# Patient Record
Sex: Female | Born: 1980
Health system: Southern US, Community
[De-identification: ages and names within clinical notes are randomized; demographics above are authoritative.]

## PROBLEM LIST (undated history)

## (undated) DIAGNOSIS — F329 Major depressive disorder, single episode, unspecified: Secondary | ICD-10-CM

## (undated) DIAGNOSIS — F32A Depression, unspecified: Secondary | ICD-10-CM

## (undated) HISTORY — DX: Depression, unspecified: F32.A

## (undated) HISTORY — DX: Major depressive disorder, single episode, unspecified: F32.9

---

## 2016-04-15 LAB — HM PAP SMEAR

## 2016-04-24 DIAGNOSIS — Z124 Encounter for screening for malignant neoplasm of cervix: Secondary | ICD-10-CM | POA: Diagnosis not present

## 2016-04-24 DIAGNOSIS — Z01419 Encounter for gynecological examination (general) (routine) without abnormal findings: Secondary | ICD-10-CM | POA: Diagnosis not present

## 2016-08-23 DIAGNOSIS — Z3480 Encounter for supervision of other normal pregnancy, unspecified trimester: Secondary | ICD-10-CM | POA: Diagnosis not present

## 2016-08-23 DIAGNOSIS — Z113 Encounter for screening for infections with a predominantly sexual mode of transmission: Secondary | ICD-10-CM | POA: Diagnosis not present

## 2016-08-23 DIAGNOSIS — Z3401 Encounter for supervision of normal first pregnancy, first trimester: Secondary | ICD-10-CM | POA: Diagnosis not present

## 2016-09-04 DIAGNOSIS — Z3687 Encounter for antenatal screening for uncertain dates: Secondary | ICD-10-CM | POA: Diagnosis not present

## 2016-09-04 DIAGNOSIS — Z3A09 9 weeks gestation of pregnancy: Secondary | ICD-10-CM | POA: Diagnosis not present

## 2016-09-20 DIAGNOSIS — Z3401 Encounter for supervision of normal first pregnancy, first trimester: Secondary | ICD-10-CM | POA: Diagnosis not present

## 2016-09-20 DIAGNOSIS — Z3A11 11 weeks gestation of pregnancy: Secondary | ICD-10-CM | POA: Diagnosis not present

## 2016-09-20 DIAGNOSIS — Z23 Encounter for immunization: Secondary | ICD-10-CM | POA: Diagnosis not present

## 2016-09-21 DIAGNOSIS — Z3689 Encounter for other specified antenatal screening: Secondary | ICD-10-CM | POA: Diagnosis not present

## 2016-09-21 LAB — OB RESULTS CONSOLE ABO/RH: RH TYPE: POSITIVE

## 2016-09-21 LAB — OB RESULTS CONSOLE GC/CHLAMYDIA
Chlamydia: NEGATIVE
Gonorrhea: NEGATIVE

## 2016-09-21 LAB — OB RESULTS CONSOLE RPR: RPR: NONREACTIVE

## 2016-09-21 LAB — OB RESULTS CONSOLE RUBELLA ANTIBODY, IGM: Rubella: IMMUNE

## 2016-09-21 LAB — OB RESULTS CONSOLE VARICELLA ZOSTER ANTIBODY, IGG: Varicella: IMMUNE

## 2016-09-21 LAB — OB RESULTS CONSOLE HIV ANTIBODY (ROUTINE TESTING): HIV: NONREACTIVE

## 2016-09-21 LAB — OB RESULTS CONSOLE HEPATITIS B SURFACE ANTIGEN: HEP B S AG: NEGATIVE

## 2016-09-21 LAB — OB RESULTS CONSOLE ANTIBODY SCREEN: Antibody Screen: NEGATIVE

## 2016-10-16 NOTE — L&D Delivery Note (Signed)
Delivery Note Pt pushed about an hour and at 11:09 AM a viable female was delivered via Vaginal, Spontaneous Delivery (Presentation: OA ).  APGAR: 9, 9; weight pending .   Placenta status: delivered spontaneously.  Cord:  with the following complications: none   Anesthesia:  epidural Episiotomy: None Lacerations:  Second degree Suture Repair: 3.0 vicryl rapide Est. Blood Loss (mL):  225mL  Mom to postpartum.  Baby to Couplet care / Skin to Skin.  Oliver PilaKathy W Jaimie Redditt 04/05/2017, 12:19 PM

## 2016-11-08 DIAGNOSIS — Q270 Congenital absence and hypoplasia of umbilical artery: Secondary | ICD-10-CM | POA: Insufficient documentation

## 2016-11-08 DIAGNOSIS — Z3A18 18 weeks gestation of pregnancy: Secondary | ICD-10-CM | POA: Diagnosis not present

## 2016-11-08 DIAGNOSIS — Z363 Encounter for antenatal screening for malformations: Secondary | ICD-10-CM | POA: Diagnosis not present

## 2016-12-06 DIAGNOSIS — Z1283 Encounter for screening for malignant neoplasm of skin: Secondary | ICD-10-CM | POA: Diagnosis not present

## 2016-12-06 DIAGNOSIS — D229 Melanocytic nevi, unspecified: Secondary | ICD-10-CM | POA: Diagnosis not present

## 2016-12-06 DIAGNOSIS — L821 Other seborrheic keratosis: Secondary | ICD-10-CM | POA: Diagnosis not present

## 2016-12-06 DIAGNOSIS — D485 Neoplasm of uncertain behavior of skin: Secondary | ICD-10-CM | POA: Diagnosis not present

## 2017-01-04 DIAGNOSIS — Z23 Encounter for immunization: Secondary | ICD-10-CM | POA: Diagnosis not present

## 2017-01-04 DIAGNOSIS — Z3689 Encounter for other specified antenatal screening: Secondary | ICD-10-CM | POA: Diagnosis not present

## 2017-01-04 DIAGNOSIS — Z3A27 27 weeks gestation of pregnancy: Secondary | ICD-10-CM | POA: Diagnosis not present

## 2017-01-08 DIAGNOSIS — O99810 Abnormal glucose complicating pregnancy: Secondary | ICD-10-CM | POA: Diagnosis not present

## 2017-01-31 DIAGNOSIS — Q270 Congenital absence and hypoplasia of umbilical artery: Secondary | ICD-10-CM | POA: Diagnosis not present

## 2017-01-31 DIAGNOSIS — Z3A30 30 weeks gestation of pregnancy: Secondary | ICD-10-CM | POA: Diagnosis not present

## 2017-02-12 DIAGNOSIS — O368931 Maternal care for other specified fetal problems, third trimester, fetus 1: Secondary | ICD-10-CM | POA: Diagnosis not present

## 2017-02-12 DIAGNOSIS — Z3A32 32 weeks gestation of pregnancy: Secondary | ICD-10-CM | POA: Diagnosis not present

## 2017-02-15 DIAGNOSIS — O368931 Maternal care for other specified fetal problems, third trimester, fetus 1: Secondary | ICD-10-CM | POA: Diagnosis not present

## 2017-02-15 DIAGNOSIS — Z3A33 33 weeks gestation of pregnancy: Secondary | ICD-10-CM | POA: Diagnosis not present

## 2017-02-19 DIAGNOSIS — Z3A33 33 weeks gestation of pregnancy: Secondary | ICD-10-CM | POA: Diagnosis not present

## 2017-02-19 DIAGNOSIS — O368931 Maternal care for other specified fetal problems, third trimester, fetus 1: Secondary | ICD-10-CM | POA: Diagnosis not present

## 2017-02-22 DIAGNOSIS — O368931 Maternal care for other specified fetal problems, third trimester, fetus 1: Secondary | ICD-10-CM | POA: Diagnosis not present

## 2017-02-22 DIAGNOSIS — Z3A34 34 weeks gestation of pregnancy: Secondary | ICD-10-CM | POA: Diagnosis not present

## 2017-02-26 DIAGNOSIS — Z3A34 34 weeks gestation of pregnancy: Secondary | ICD-10-CM | POA: Diagnosis not present

## 2017-02-26 DIAGNOSIS — O368921 Maternal care for other specified fetal problems, second trimester, fetus 1: Secondary | ICD-10-CM | POA: Diagnosis not present

## 2017-03-01 DIAGNOSIS — Z3685 Encounter for antenatal screening for Streptococcus B: Secondary | ICD-10-CM | POA: Diagnosis not present

## 2017-03-01 DIAGNOSIS — Z3A35 35 weeks gestation of pregnancy: Secondary | ICD-10-CM | POA: Diagnosis not present

## 2017-03-01 DIAGNOSIS — O368921 Maternal care for other specified fetal problems, second trimester, fetus 1: Secondary | ICD-10-CM | POA: Diagnosis not present

## 2017-03-02 LAB — OB RESULTS CONSOLE GBS: STREP GROUP B AG: NEGATIVE

## 2017-03-06 DIAGNOSIS — O368931 Maternal care for other specified fetal problems, third trimester, fetus 1: Secondary | ICD-10-CM | POA: Diagnosis not present

## 2017-03-06 DIAGNOSIS — Q270 Congenital absence and hypoplasia of umbilical artery: Secondary | ICD-10-CM | POA: Diagnosis not present

## 2017-03-06 DIAGNOSIS — Z3A35 35 weeks gestation of pregnancy: Secondary | ICD-10-CM | POA: Diagnosis not present

## 2017-03-09 DIAGNOSIS — Z3A36 36 weeks gestation of pregnancy: Secondary | ICD-10-CM | POA: Diagnosis not present

## 2017-03-09 DIAGNOSIS — O368931 Maternal care for other specified fetal problems, third trimester, fetus 1: Secondary | ICD-10-CM | POA: Diagnosis not present

## 2017-03-13 DIAGNOSIS — O368931 Maternal care for other specified fetal problems, third trimester, fetus 1: Secondary | ICD-10-CM | POA: Diagnosis not present

## 2017-03-13 DIAGNOSIS — Z3A36 36 weeks gestation of pregnancy: Secondary | ICD-10-CM | POA: Diagnosis not present

## 2017-03-16 DIAGNOSIS — Z3A37 37 weeks gestation of pregnancy: Secondary | ICD-10-CM | POA: Diagnosis not present

## 2017-03-16 DIAGNOSIS — O368931 Maternal care for other specified fetal problems, third trimester, fetus 1: Secondary | ICD-10-CM | POA: Diagnosis not present

## 2017-03-19 DIAGNOSIS — O368931 Maternal care for other specified fetal problems, third trimester, fetus 1: Secondary | ICD-10-CM | POA: Diagnosis not present

## 2017-03-19 DIAGNOSIS — Z3A37 37 weeks gestation of pregnancy: Secondary | ICD-10-CM | POA: Diagnosis not present

## 2017-03-22 DIAGNOSIS — O368931 Maternal care for other specified fetal problems, third trimester, fetus 1: Secondary | ICD-10-CM | POA: Diagnosis not present

## 2017-03-22 DIAGNOSIS — Z3A38 38 weeks gestation of pregnancy: Secondary | ICD-10-CM | POA: Diagnosis not present

## 2017-03-26 DIAGNOSIS — O368921 Maternal care for other specified fetal problems, second trimester, fetus 1: Secondary | ICD-10-CM | POA: Diagnosis not present

## 2017-03-26 DIAGNOSIS — Z3A38 38 weeks gestation of pregnancy: Secondary | ICD-10-CM | POA: Diagnosis not present

## 2017-03-29 ENCOUNTER — Telehealth (HOSPITAL_COMMUNITY): Payer: Self-pay | Admitting: *Deleted

## 2017-03-29 ENCOUNTER — Encounter (HOSPITAL_COMMUNITY): Payer: Self-pay | Admitting: *Deleted

## 2017-03-29 DIAGNOSIS — O368999 Maternal care for other specified fetal problems, unspecified trimester, other fetus: Secondary | ICD-10-CM | POA: Diagnosis not present

## 2017-03-29 DIAGNOSIS — Z3A39 39 weeks gestation of pregnancy: Secondary | ICD-10-CM | POA: Diagnosis not present

## 2017-03-29 NOTE — Telephone Encounter (Signed)
Preadmission screen  

## 2017-04-02 DIAGNOSIS — O368931 Maternal care for other specified fetal problems, third trimester, fetus 1: Secondary | ICD-10-CM | POA: Diagnosis not present

## 2017-04-02 DIAGNOSIS — Z3A39 39 weeks gestation of pregnancy: Secondary | ICD-10-CM | POA: Diagnosis not present

## 2017-04-05 ENCOUNTER — Inpatient Hospital Stay (HOSPITAL_COMMUNITY)
Admission: AD | Admit: 2017-04-05 | Discharge: 2017-04-07 | DRG: 775 | Disposition: A | Discharge status: Home / Self Care | Payer: BLUE CROSS/BLUE SHIELD | Source: Ambulatory Visit | Attending: Obstetrics and Gynecology | Admitting: Obstetrics and Gynecology

## 2017-04-05 ENCOUNTER — Inpatient Hospital Stay (HOSPITAL_COMMUNITY): Payer: BLUE CROSS/BLUE SHIELD | Admitting: Anesthesiology

## 2017-04-05 ENCOUNTER — Encounter (HOSPITAL_COMMUNITY): Payer: Self-pay | Admitting: *Deleted

## 2017-04-05 DIAGNOSIS — Z3A40 40 weeks gestation of pregnancy: Secondary | ICD-10-CM

## 2017-04-05 DIAGNOSIS — Z3493 Encounter for supervision of normal pregnancy, unspecified, third trimester: Secondary | ICD-10-CM | POA: Diagnosis not present

## 2017-04-05 DIAGNOSIS — O99340 Other mental disorders complicating pregnancy, unspecified trimester: Secondary | ICD-10-CM | POA: Diagnosis not present

## 2017-04-05 DIAGNOSIS — F329 Major depressive disorder, single episode, unspecified: Secondary | ICD-10-CM | POA: Diagnosis not present

## 2017-04-05 DIAGNOSIS — Z23 Encounter for immunization: Secondary | ICD-10-CM | POA: Diagnosis not present

## 2017-04-05 LAB — COMPREHENSIVE METABOLIC PANEL
ALBUMIN: 3 g/dL — AB (ref 3.5–5.0)
ALT: 12 U/L — AB (ref 14–54)
AST: 22 U/L (ref 15–41)
Alkaline Phosphatase: 186 U/L — ABNORMAL HIGH (ref 38–126)
Anion gap: 12 (ref 5–15)
BUN: 11 mg/dL (ref 6–20)
CHLORIDE: 106 mmol/L (ref 101–111)
CO2: 18 mmol/L — ABNORMAL LOW (ref 22–32)
CREATININE: 0.72 mg/dL (ref 0.44–1.00)
Calcium: 9 mg/dL (ref 8.9–10.3)
GFR calc Af Amer: >60 mL/min (ref >60)
Glucose, Bld: 108 mg/dL — ABNORMAL HIGH (ref 65–99)
Potassium: 3.9 mmol/L (ref 3.5–5.1)
Sodium: 136 mmol/L (ref 135–145)
Total Bilirubin: 0.4 mg/dL (ref 0.3–1.2)
Total Protein: 6 g/dL — ABNORMAL LOW (ref 6.5–8.1)

## 2017-04-05 LAB — LACTATE DEHYDROGENASE: LDH: 150 U/L (ref 98–192)

## 2017-04-05 LAB — CBC
HCT: 36.3 % (ref 36.0–46.0)
Hemoglobin: 12.7 g/dL (ref 12.0–15.0)
MCH: 30 pg (ref 26.0–34.0)
MCHC: 35 g/dL (ref 30.0–36.0)
MCV: 85.6 fL (ref 78.0–100.0)
PLATELETS: 197 10*3/uL (ref 150–400)
RBC: 4.24 MIL/uL (ref 3.87–5.11)
RDW: 14.1 % (ref 11.5–15.5)
WBC: 16.4 10*3/uL — AB (ref 4.0–10.5)

## 2017-04-05 LAB — PROTEIN / CREATININE RATIO, URINE
Creatinine, Urine: 152 mg/dL
PROTEIN CREATININE RATIO: 0.14 mg/mg{creat} (ref 0.00–0.15)
Total Protein, Urine: 22 mg/dL

## 2017-04-05 LAB — TYPE AND SCREEN
ABO/RH(D): A POS
ANTIBODY SCREEN: NEGATIVE

## 2017-04-05 LAB — URIC ACID: Uric Acid, Serum: 6.7 mg/dL — ABNORMAL HIGH (ref 2.3–6.6)

## 2017-04-05 LAB — RPR: RPR: NONREACTIVE

## 2017-04-05 LAB — ABO/RH: ABO/RH(D): A POS

## 2017-04-05 MED ORDER — EPHEDRINE 5 MG/ML INJ
10.0000 mg | INTRAVENOUS | Status: DC | PRN
  Filled 2017-04-05: qty 2

## 2017-04-05 MED ORDER — FLEET ENEMA 7-19 GM/118ML RE ENEM: 1.0000 | ENEMA | RECTAL | Status: DC | PRN

## 2017-04-05 MED ORDER — ZOLPIDEM TARTRATE 5 MG PO TABS
5.0000 mg | ORAL_TABLET | Freq: Every evening | ORAL | Status: DC | PRN
Start: 2017-04-05 — End: 2017-04-07

## 2017-04-05 MED ORDER — LIDOCAINE HCL (PF) 1 % IJ SOLN
30.0000 mL | INTRAMUSCULAR | Status: DC | PRN
  Filled 2017-04-05: qty 30

## 2017-04-05 MED ORDER — PHENYLEPHRINE 40 MCG/ML (10ML) SYRINGE FOR IV PUSH (FOR BLOOD PRESSURE SUPPORT)
PREFILLED_SYRINGE | INTRAVENOUS | Status: AC
  Filled 2017-04-05: qty 10

## 2017-04-05 MED ORDER — ACETAMINOPHEN 325 MG PO TABS
650.0000 mg | ORAL_TABLET | ORAL | Status: DC | PRN
Start: 2017-04-05 — End: 2017-04-07
  Administered 2017-04-06 – 2017-04-07 (×3): 650 mg via ORAL
  Filled 2017-04-05 (×3): qty 2

## 2017-04-05 MED ORDER — COCONUT OIL OIL: 1.0000 "application " | TOPICAL_OIL | Status: DC | PRN

## 2017-04-05 MED ORDER — FENTANYL 2.5 MCG/ML BUPIVACAINE 1/10 % EPIDURAL INFUSION (WH - ANES)
14.0000 mL/h | INTRAMUSCULAR | Status: DC | PRN
  Administered 2017-04-05: 14 mL/h via EPIDURAL

## 2017-04-05 MED ORDER — TETANUS-DIPHTH-ACELL PERTUSSIS 5-2.5-18.5 LF-MCG/0.5 IM SUSP: 0.5000 mL | Freq: Once | INTRAMUSCULAR | Status: DC

## 2017-04-05 MED ORDER — WITCH HAZEL-GLYCERIN EX PADS
1.0000 "application " | MEDICATED_PAD | CUTANEOUS | Status: DC | PRN
  Administered 2017-04-05: 1 via TOPICAL

## 2017-04-05 MED ORDER — SOD CITRATE-CITRIC ACID 500-334 MG/5ML PO SOLN: 30.0000 mL | ORAL | Status: DC | PRN

## 2017-04-05 MED ORDER — DIPHENHYDRAMINE HCL 50 MG/ML IJ SOLN: 12.5000 mg | INTRAMUSCULAR | Status: DC | PRN

## 2017-04-05 MED ORDER — OXYCODONE-ACETAMINOPHEN 5-325 MG PO TABS: 2.0000 | ORAL_TABLET | ORAL | Status: DC | PRN

## 2017-04-05 MED ORDER — BENZOCAINE-MENTHOL 20-0.5 % EX AERO
1.0000 "application " | INHALATION_SPRAY | CUTANEOUS | Status: DC | PRN
  Administered 2017-04-05: 1 via TOPICAL
  Filled 2017-04-05: qty 56

## 2017-04-05 MED ORDER — ONDANSETRON HCL 4 MG/2ML IJ SOLN
4.0000 mg | INTRAMUSCULAR | Status: DC | PRN
Start: 2017-04-05 — End: 2017-04-07

## 2017-04-05 MED ORDER — ONDANSETRON HCL 4 MG PO TABS: 4.0000 mg | ORAL_TABLET | ORAL | Status: DC | PRN

## 2017-04-05 MED ORDER — ACETAMINOPHEN 325 MG PO TABS: 650.0000 mg | ORAL_TABLET | ORAL | Status: DC | PRN

## 2017-04-05 MED ORDER — LACTATED RINGERS IV SOLN: 500.0000 mL | INTRAVENOUS | Status: DC | PRN

## 2017-04-05 MED ORDER — OXYTOCIN BOLUS FROM INFUSION
500.0000 mL | Freq: Once | INTRAVENOUS | Status: AC
  Administered 2017-04-05: 500 mL via INTRAVENOUS

## 2017-04-05 MED ORDER — PHENYLEPHRINE 40 MCG/ML (10ML) SYRINGE FOR IV PUSH (FOR BLOOD PRESSURE SUPPORT)
80.0000 ug | PREFILLED_SYRINGE | INTRAVENOUS | Status: DC | PRN
  Filled 2017-04-05: qty 5

## 2017-04-05 MED ORDER — OXYCODONE-ACETAMINOPHEN 5-325 MG PO TABS: 1.0000 | ORAL_TABLET | ORAL | Status: DC | PRN

## 2017-04-05 MED ORDER — ONDANSETRON HCL 4 MG/2ML IJ SOLN
4.0000 mg | Freq: Four times a day (QID) | INTRAMUSCULAR | Status: DC | PRN
  Administered 2017-04-05 (×2): 4 mg via INTRAVENOUS
  Filled 2017-04-05 (×2): qty 2

## 2017-04-05 MED ORDER — SENNOSIDES-DOCUSATE SODIUM 8.6-50 MG PO TABS
2.0000 | ORAL_TABLET | ORAL | Status: DC
  Administered 2017-04-06 (×2): 2 via ORAL
  Filled 2017-04-05 (×2): qty 2

## 2017-04-05 MED ORDER — SIMETHICONE 80 MG PO CHEW: 80.0000 mg | CHEWABLE_TABLET | ORAL | Status: DC | PRN

## 2017-04-05 MED ORDER — DIPHENHYDRAMINE HCL 25 MG PO CAPS: 25.0000 mg | ORAL_CAPSULE | Freq: Four times a day (QID) | ORAL | Status: DC | PRN

## 2017-04-05 MED ORDER — LIDOCAINE HCL (PF) 1 % IJ SOLN
INTRAMUSCULAR | Status: DC | PRN
  Administered 2017-04-05: 5 mL via EPIDURAL
  Administered 2017-04-05: 6 mL
  Administered 2017-04-05: 4 mL

## 2017-04-05 MED ORDER — FENTANYL 2.5 MCG/ML BUPIVACAINE 1/10 % EPIDURAL INFUSION (WH - ANES)
INTRAMUSCULAR | Status: AC
  Filled 2017-04-05: qty 100

## 2017-04-05 MED ORDER — DIBUCAINE 1 % RE OINT: 1.0000 "application " | TOPICAL_OINTMENT | RECTAL | Status: DC | PRN

## 2017-04-05 MED ORDER — PRENATAL MULTIVITAMIN CH
1.0000 | ORAL_TABLET | Freq: Every day | ORAL | Status: DC
  Administered 2017-04-06: 1 via ORAL
  Filled 2017-04-05: qty 1

## 2017-04-05 MED ORDER — LACTATED RINGERS IV SOLN: 500.0000 mL | Freq: Once | INTRAVENOUS | Status: DC

## 2017-04-05 MED ORDER — OXYTOCIN 40 UNITS IN LACTATED RINGERS INFUSION - SIMPLE MED
2.5000 [IU]/h | INTRAVENOUS | Status: DC
  Filled 2017-04-05: qty 1000

## 2017-04-05 MED ORDER — IBUPROFEN 600 MG PO TABS
600.0000 mg | ORAL_TABLET | Freq: Four times a day (QID) | ORAL | Status: DC
  Administered 2017-04-05 – 2017-04-07 (×7): 600 mg via ORAL
  Filled 2017-04-05 (×9): qty 1

## 2017-04-05 MED ORDER — LACTATED RINGERS IV SOLN
INTRAVENOUS | Status: DC
  Administered 2017-04-05: 05:00:00 via INTRAVENOUS

## 2017-04-05 NOTE — Progress Notes (Signed)
Patient ID: Jean MayoJennifer Gluth, female   DOB: 06/09/1981, 36 y.o.   MRN: [id number] Per Dr. Mindi SlickerBanga, pt seen and does not want to push as feeling no pressure.  Will recheck in an hour FHR category 1 with occasional mild early decels

## 2017-04-05 NOTE — Anesthesia Postprocedure Evaluation (Signed)
Anesthesia Post Note  Patient: Jean [name]  Procedure(s) Performed: * No procedures listed *     Patient location during evaluation: Mother Baby Anesthesia Type: Epidural Level of consciousness: awake and alert Pain management: pain level controlled Vital Signs Assessment: post-procedure vital signs reviewed and stable Respiratory status: spontaneous breathing, nonlabored ventilation and respiratory function stable Cardiovascular status: stable Postop Assessment: no headache, no backache and epidural receding Anesthetic complications: no    Last Vitals:  Vitals:   04/05/17 1326 04/05/17 1438  BP: 138/66 140/85  Pulse: (!) 57 60  Resp: 18 18  Temp: 36.8 C 36.4 C    Last Pain:  Vitals:   04/05/17 1438  TempSrc: Oral  PainSc:    Pain Goal: Patients Stated Pain Goal: 0 (04/05/17 0149)               Junious SilkGILBERT,Janeli Lewison

## 2017-04-05 NOTE — Lactation Note (Signed)
This note was copied from a baby's chart. Lactation Consultation Note  Patient Name: Jean [name] ZOXWR'UToday's Date: 04/05/2017   Attempted to consult with mom of < 1 hour old [facility]. Doula and FOB present in the room. Infant STS and fussy. Mom feeling very nauseated and gave infant to FOB for STS. Crackers and Ginger Ale taken to mom. RN was notified and Mom receiving anti nausea medication and LC will return at a later time.      Maternal Data    Feeding    LATCH Score/Interventions                      Lactation Tools Discussed/Used     Consult Status      Ed BlalockSharon S Erhard Senske 04/05/2017, 11:45 AM

## 2017-04-05 NOTE — Progress Notes (Signed)
Patient ID: Jean MayoJennifer Odom, female   DOB: 03/13/1981, 36 y.o.   MRN: [id number] Pt now consenting to exam afeb VSS FHR with mild variable decels  C/c/+2  To begin pushing

## 2017-04-05 NOTE — Progress Notes (Signed)
Patient ID: Jean MayoJennifer [name], female   DOB: 06/18/1981, 36 y.o.   MRN: [id number] Pt comfortable with epidural. She agreed to AROM now.  AROM performed with moderate return of clear fluid Attempted pushing for 20 mins with no descent Cat 1 strip Ctxs q 3mins 10/100/+1  Plan: Labor down for 30 mins then resume pushing          Anticipate svd

## 2017-04-05 NOTE — Anesthesia Pain Management Evaluation Note (Signed)
  CRNA Pain Management Visit Note  Patient: Jean MayoJennifer Anello, 36 y.o., female  "Hello I am a member of the anesthesia team at [facility]. We have an anesthesia team available at all times to provide care throughout the hospital, including epidural management and anesthesia for C-section. I don't know your plan for the delivery whether it a natural birth, water birth, IV sedation, nitrous supplementation, doula or epidural, but we want to meet your pain goals."   1.Was your pain managed to your expectations on prior hospitalizations?   No prior hospitalizations  2.What is your expectation for pain management during this hospitalization?     Epidural  3.How can we help you reach that goal? Epidural intact  Record the patient's initial score and the patient's pain goal.   Pain: 1  Pain Goal: 9 [facility] wants you to be able to say your pain was always managed very well.  Edison PaceWILKERSON,Ailany Koren 04/05/2017

## 2017-04-05 NOTE — Lactation Note (Signed)
This note was copied from a baby's chart. Lactation Consultation Note  Patient Name: Girl Perlie MayoJennifer Vidrine HYQMV'HToday's Date: 04/05/2017 Reason for consult: Initial assessment   Initial assessment with mom of < 1 hour old infant. Infant STS with FOB and cueing to feed. Mom reluctant to latch infant to the breast as she is not feeling well. Discussed that infant is cueing to feed and is good to latch infant with feeding cues or we may miss her feeding. Mom then agreeable to latching infant.   Mom did not want to place infant across her abdomen. Infant was placed to left breast in the football hold. She had some difficulty maintaining latch and fed off and on for 15 minutes. She was then repositioned to right breast and fed for about 15 minutes in the football hold. Infant then fell asleep and was place back STS with FOB. Mom drowsy and LC held infant for entire feeding, mom asked LC to stay for entire feeding. Mom did note some pinching at times with feeding, infant was noted to be tongue sucking and tongue thrusting at times.   Mom with semi compressible breasts with short shaft everted nipples, unable to hand express colostrum at this time. Mom reports + breast changes with pregnancy. Enc mom to feed infant STS 8-12 x in 24 hours at first feeding cues. Enc mom to hand express prior to each feeding. Enc mom to feed infant as long as she desires, offering both breasts with each feeding. Enc mom to massage/compress breast with feeding. Advised mom to use good head and pillow support with feeding.   Reviewed colostrum, milk coming to volume, supply and demand, cluster feeding, NB nutritional needs, and NB feeding behaviors discussed.Feel mom will need reiteration of all teaching. Feeding log given with instructions for use.Enc mom to call out for feeding assistance as needed.    BF Resources Handout and LC Brochure, mom informed of IP/OP services, BF Support Groups and LC phone #. Mom and dad without further  questions/concerns at this time.    Maternal Data Formula Feeding for Exclusion: No Has patient been taught Hand Expression?: Yes Does the patient have breastfeeding experience prior to this delivery?: No  Feeding Feeding Type: Breast Fed Length of feed: 30 min  LATCH Score/Interventions Latch: Repeated attempts needed to sustain latch, nipple held in mouth throughout feeding, stimulation needed to elicit sucking reflex. Intervention(s): Adjust position;Assist with latch;Breast massage;Breast compression  Audible Swallowing: A few with stimulation Intervention(s): Skin to skin;Hand expression;Alternate breast massage  Type of Nipple: Everted at rest and after stimulation  Comfort (Breast/Nipple): Soft / non-tender     Hold (Positioning): Full assist, staff holds infant at breast Intervention(s): Breastfeeding basics reviewed;Support Pillows;Position options;Skin to skin  LATCH Score: 6  Lactation Tools Discussed/Used WIC Program: No   Consult Status Consult Status: Follow-up Date: 04/06/17 Follow-up type: In-patient    Silas FloodSharon S Donalee Gaumond 04/05/2017, 12:46 PM

## 2017-04-05 NOTE — Anesthesia Preprocedure Evaluation (Signed)

## 2017-04-05 NOTE — Anesthesia Procedure Notes (Signed)
Epidural Patient location during procedure: OB  Staffing Anesthesiologist: Cristela BlueJACKSON, Jean Jean [name]  Preanesthetic Checklist Completed: patient identified, site marked, surgical consent, pre-op evaluation, timeout performed, IV checked, risks and benefits discussed and monitors and equipment checked  Epidural Patient position: sitting Prep: site prepped and draped and DuraPrep Patient monitoring: continuous pulse ox and blood pressure Approach: midline Location: L4-L5 Injection technique: LOR air  Needle:  Needle type: Tuohy  Needle gauge: 17 G Needle length: 9 cm and 9 Needle insertion depth: 6 cm Catheter type: closed end flexible Catheter size: 19 Gauge Catheter at skin depth: 12 cm Test dose: negative  Assessment Events: blood not aspirated, injection not painful, no injection resistance, negative IV test and no paresthesia  Additional Notes Initial dosing of Epidural:  1st dose, through catheter ............................................Marland Kitchen.  Xylocaine 40 mg  2nd dose, through catheter, after waiting 3 minutes........Marland Kitchen.Xylocaine 60 mg    As each dose occurred, patient was free of IV sx; and patient exhibited no evidence of SA injection.  Patient is more comfortable after epidural dosed. Please see RN's note for documentation of vital signs,and FHR which are stable.  Patient reminded not to try to ambulate with numb legs, and that an RN must be present when she attempts to get up.

## 2017-04-05 NOTE — H&P (Signed)
Jean [name] is a 36 y.o. female presenting for painful contractions. Pt states they have been intermittent the past few weeks but intensified at 8-10pm last night. On arrival pt was 7cm ( MAU) and then complete on L/D. Her prenatal care has been complicated by a single umbilical artery. She got twice weekly NSTs starting at 32weeks. Hx depression- weaned off zoloft at [redacted]weeks gestation; plans to restart pp. Dating per lmp and confirmed with 9 week [place]. Declined genetic and carrier screening OB History    Gravida Para Term Preterm AB Living   1             SAB TAB Ectopic Multiple Live Births                 Past Medical History:  Diagnosis Date  . Depression    No past surgical history on file. Family History: family history is not on file. Social History:  has no tobacco, alcohol, and drug history on file.     Maternal Diabetes: No Genetic Screening: Declined Maternal Ultrasounds/Referrals: Normal Fetal Ultrasounds or other Referrals:  None Maternal Substance Abuse:  No Significant Maternal Medications:  None Significant Maternal Lab Results:  Lab values include: Group B Strep negative Other Comments:  None  Review of Systems  Constitutional: Positive for malaise/fatigue. Negative for chills, fever and weight loss.  Eyes: Negative for blurred vision.  Respiratory: Negative for shortness of breath.   Cardiovascular: Negative for chest pain.  Gastrointestinal: Positive for abdominal pain. Negative for heartburn, nausea and vomiting.  Genitourinary: Negative for dysuria.  Musculoskeletal: Positive for back pain and myalgias.  Skin: Negative for itching and rash.  Neurological: Negative for dizziness and headaches.  Psychiatric/Behavioral: Negative for depression, hallucinations, substance abuse and suicidal ideas. The patient is not nervous/anxious.    Maternal Medical History:  Reason for admission: Contractions.  Nausea.  Contractions: Onset was 3-5 hours ago.    Frequency: regular.   Perceived severity is strong.    Fetal activity: Perceived fetal activity is normal.   Last perceived fetal movement was within the past hour.    Prenatal complications: no prenatal complications Prenatal Complications - Diabetes: none.    Dilation: 10 Effacement (%): 100 Station: +1 Exam by:: Irving Burton Rothermel RN  Blood pressure (!) 150/113, pulse 76, temperature 98.3 F (36.8 C), temperature source Axillary, resp. rate 18, height 5\' 9"  (1.753 m), weight 237 lb (107.5 kg), last menstrual period 06/29/2016, SpO2 98 %. Maternal Exam:  Uterine Assessment: Contraction strength is firm.  Contraction frequency is regular.   Abdomen: Patient reports generalized tenderness.  Estimated fetal weight is AGA.   Fetal presentation: vertex  Introitus: Normal vulva. Vulva is negative for condylomata and lesion.  Normal vagina.  Vagina is negative for condylomata and discharge.  Pelvis: adequate for delivery.   Cervix: Cervix evaluated by digital exam.     Physical Exam  Constitutional: She is oriented to person, place, and time. She appears well-developed and well-nourished.  Neck: Normal range of motion.  Cardiovascular: Normal rate.   Respiratory: Effort normal.  GI: Soft. There is generalized tenderness.  Genitourinary: Vagina normal and uterus normal. Vulva exhibits no lesion. No vaginal discharge found.  Musculoskeletal: Normal range of motion.  Neurological: She is alert and oriented to person, place, and time.  Skin: Skin is warm.  Psychiatric: She has a normal mood and affect. Her behavior is normal. Judgment and thought content normal.    Prenatal labs: ABO, Rh: --/--/A POS (06/21 0232) Antibody:  NEG (06/21 0232) Rubella: Immune (12/07 0000) RPR: Nonreactive (12/07 0000)  HBsAg: Negative (12/07 0000)  HIV: Non-reactive (12/07 0000)  GBS: Negative (05/18 0000)   Assessment/Plan: G1P0 at 40 0/[redacted]wks gestation in active labor Pain control per  epidural GBS negative Anticipate svd   Cecilia W Banga 04/05/2017, 3:37 AM

## 2017-04-05 NOTE — Progress Notes (Signed)
Patient ID: Jean MayoJennifer [name], female   DOB: 08/10/1981, 36 y.o.   MRN: [id number] Pt comfortable with epidural but trying to get her strength up afeb vss FHR category 1  Pt declines cervical check, saying she would like to wait until feeling stronger  Since FHR looks fine, agree to wait but reminded her she has not been examined since 520am

## 2017-04-05 NOTE — MAU Note (Signed)
Contractions since 8 pm, got stronger at 10 pm. No leaking. No bleeding. Baby moving well.

## 2017-04-06 LAB — CBC
HEMATOCRIT: 34.4 % — AB (ref 36.0–46.0)
Hemoglobin: 11.5 g/dL — ABNORMAL LOW (ref 12.0–15.0)
MCH: 29.7 pg (ref 26.0–34.0)
MCHC: 33.4 g/dL (ref 30.0–36.0)
MCV: 88.9 fL (ref 78.0–100.0)
PLATELETS: 177 10*3/uL (ref 150–400)
RBC: 3.87 MIL/uL (ref 3.87–5.11)
RDW: 14.4 % (ref 11.5–15.5)
WBC: 17.8 10*3/uL — ABNORMAL HIGH (ref 4.0–10.5)

## 2017-04-06 NOTE — Progress Notes (Signed)
Patient ID: Jean MayoJennifer Smoots, female   DOB: 12/03/1980, 36 y.o.   MRN: [id number] PPD #1 No problems Afeb, VSS Fundus firm, NT at U-1 Continue routine postpartum care

## 2017-04-06 NOTE — Progress Notes (Signed)
Jean [name] was referred for history of depression/anxiety. * Referral screened out by Clinical Social Worker because none of the following criteria appear to apply: ~ History of anxiety/depression during this pregnancy, or of post-partum depression. ~ Diagnosis of anxiety and/or depression within last 3 years OR * Jean [name]'s symptoms currently being treated with medication and/or therapy. Please contact the Clinical Social Worker if needs arise, or if Jean [name] requests.  Jean [name]'s chart notes "hx depression- weaned off zoloft at [redacted]weeks gestation; plans to restart pp."  CSW spoke with bedside RN to ensure this is addressed now that patient has delivered. 

## 2017-04-06 NOTE — Lactation Note (Signed)
This note was copied from a baby's chart. Lactation Consultation Note  Patient Name: Jean Perlie MayoJennifer Hing RUEAV'WToday's Date: 04/06/2017 Reason for consult: Follow-up assessment Mom asking for assist due to sore nipples.  She has positional stripes on both nipples and c/o pain with feedings.  Oral exam done.  Baby has good extension of tongue but some restriction with elevation. Possible tight posterior frenulum.   Assisted mom with positioning baby in football hold.  Baby opened wide and latched easily with lips flanged.  Mom states pain is a 7/10.  We took baby off breast.  Nipple is slightly pinched.  24 mm nipple shield applied.  Baby latched well and pain down to 4.  Baby nursed actively for 15 minutes and colostrum in shield after feeding.  Mom feeling more hopeful.  DEBP set up and initiated.  Instructed to pump 4-6 times per day after feeding to increase stimulation.  Breast shells given with instructions.  Parents asking many good questions which were answered.  Encouraged to call for assist/concerns.  Maternal Data    Feeding Feeding Type: Breast Fed Length of feed: 15 min  LATCH Score/Interventions Latch: Grasps breast easily, tongue down, lips flanged, rhythmical sucking. Intervention(s): Breast compression;Breast massage;Assist with latch;Adjust position  Audible Swallowing: Spontaneous and intermittent Intervention(s): Skin to skin;Hand expression;Alternate breast massage  Type of Nipple: Everted at rest and after stimulation  Comfort (Breast/Nipple): Filling, red/small blisters or bruises, mild/mod discomfort  Problem noted: Mild/Moderate discomfort;Cracked, bleeding, blisters, bruises Interventions (Mild/moderate discomfort): Hand expression  Hold (Positioning): Assistance needed to correctly position infant at breast and maintain latch. Intervention(s): Breastfeeding basics reviewed;Support Pillows;Position options;Skin to skin  LATCH Score: 8  Lactation Tools  Discussed/Used Tools: Nipple Shields Nipple shield size: 24 Pump Review: Setup, frequency, and cleaning;Milk Storage Initiated by:: LC Date initiated:: 04/07/17   Consult Status Consult Status: Follow-up Date: 04/07/17 Follow-up type: In-patient    Huston FoleyMOULDEN, Megan Presti S 04/06/2017, 3:24 PM

## 2017-04-07 MED ORDER — IBUPROFEN 600 MG PO TABS: 600.0000 mg | ORAL_TABLET | Freq: Four times a day (QID) | ORAL | 0 refills | Status: DC

## 2017-04-07 NOTE — Discharge Summary (Signed)
OB Discharge Summary     Patient Name: Jean [name] DOB: 1981-02-22 MRN: [id number]  Date of admission: 04/05/2017 Delivering MD: Huel Cote   Date of discharge: 04/07/2017  Admitting diagnosis: 40 WEEKS CTX Intrauterine pregnancy: [redacted]w[redacted]d     Secondary diagnosis:  Active Problems:   Normal labor   NSVD (normal spontaneous vaginal delivery)      Discharge diagnosis: Term Pregnancy Delivered                                  Hospital course:  Onset of Labor With Vaginal Delivery     36 y.o. yo G1P1001 at [redacted]w[redacted]d was admitted in Active Labor on 04/05/2017. Patient had an uncomplicated labor course as follows:  Membrane Rupture Time/Date: 5:03 AM ,04/05/2017   Intrapartum Procedures: Episiotomy: None [1]                                         Lacerations:  2nd degree [3];Perineal [11]  Patient had a delivery of a Viable infant. 04/05/2017  Information for the patient's newborn:  Jean, Gretzinger Girl [name] [[id number]]  Delivery Method: Vaginal, Spontaneous Delivery (Filed from Delivery Summary)    Pateint had an uncomplicated postpartum course.  She is ambulating, tolerating a regular diet, passing flatus, and urinating well. Patient is discharged home in stable condition on 04/07/17.   Physical exam  Vitals:   04/06/17 0130 04/06/17 0545 04/06/17 1833 04/07/17 0534  BP: 135/76 132/75 134/68 134/76  Pulse: 75 64 73 68  Resp: 18 18 18 18   Temp: 97.7 F (36.5 C) 98.3 F (36.8 C) 98.3 F (36.8 C) 98 F (36.7 C)  TempSrc: Oral Oral Oral Oral  SpO2: 100% 97%    Weight:      Height:       General: alert Lochia: appropriate Uterine Fundus: firm  Labs: Lab Results  Component Value Date   WBC 17.8 (H) 04/06/2017   HGB 11.5 (L) 04/06/2017   HCT 34.4 (L) 04/06/2017   MCV 88.9 04/06/2017   PLT 177 04/06/2017   CMP Latest Ref Rng & Units 04/05/2017  Glucose 65 - 99 mg/dL 782(N)  BUN 6 - 20 mg/dL 11  Creatinine 5.62 - 1.30 mg/dL 8.65  Sodium 784 - 696 mmol/L 136   Potassium 3.5 - 5.1 mmol/L 3.9  Chloride 101 - 111 mmol/L 106  CO2 22 - 32 mmol/L 18(L)  Calcium 8.9 - 10.3 mg/dL 9.0  Total Protein 6.5 - 8.1 g/dL 6.0(L)  Total Bilirubin 0.3 - 1.2 mg/dL 0.4  Alkaline Phos 38 - 126 U/L 186(H)  AST 15 - 41 U/L 22  ALT 14 - 54 U/L 12(L)    Discharge instruction: per After Visit Summary and "Baby and Me Booklet".  After visit meds:  Allergies as of 04/07/2017   No Known Allergies     Medication List    STOP taking these medications   ranitidine 75 MG tablet Commonly known as:  ZANTAC     TAKE these medications   cetirizine 10 MG tablet Commonly known as:  ZYRTEC Take 10 mg by mouth daily.   ibuprofen 600 MG tablet Commonly known as:  ADVIL,MOTRIN Take 1 tablet (600 mg total) by mouth every 6 (six) hours.   prenatal multivitamin Tabs tablet Take 1 tablet by mouth daily at 12 noon.  Diet: routine diet  Activity: Advance as tolerated. Pelvic rest for 6 weeks.   Outpatient follow up:3 weeks   Newborn Data: Live born female  Birth Weight: 8 lb 13.5 oz (4010 g) APGAR: 9, 9  Baby Feeding: Breast Disposition:home with mother   04/07/2017 Jean Nieceodd D Orly Quimby, MD

## 2017-04-07 NOTE — Lactation Note (Signed)
This note was copied from a baby's chart. Lactation Consultation Note  Patient Name: Jean Perlie MayoJennifer Vinsant ZOXWR'UToday's Date: [name] Reason for consult: Follow-up assessment;Infant weight loss (6% weight loss , Serum Bli - 9.6 this am )  Baby is 6247 hours old and for D/C today.  LC reviewed and updated doc flow sheets Per mom sore nipples have improved with the Shells and nipple Shield.  LC assessed nipples with moms permission and noted the positional strips still present,  LC recommended to continue using the Nipple shield and instill EBM into the top with a curved tip syringe  For an appetizer with every feeding [facility] . On Friday 6/29 at 11:30 am.  Per mom has a DEBP Medela at home and plans to start post pumping , didn't get a chance in the hospital.  ( even though it was set up yesterday . LC instructed mom on the use of hand pump for pre - pumping if needed. Sore nipple and engorgement prevention and tx reviewed.  Mother informed of post-discharge support and given phone number to the lactation department, including services for phone call assistance; out-patient appointments; and breastfeeding support group. List of other breastfeeding resources in the community given in the handout. Encouraged mother to call for problems or concerns related to breastfeeding.    Maternal Data    Feeding Feeding Type:  (per mom baby last fed at 7:25 am for 35 mins and milk noted in the NS ) Length of feed: 30 min  LATCH Score/Interventions ( [facility] )  Latch: Grasps breast easily, tongue down, lips flanged, rhythmical sucking. Intervention(s): Breast compression;Breast massage  Audible Swallowing: Spontaneous and intermittent Intervention(s): Skin to skin  Type of Nipple: Everted at rest and after stimulation  Comfort (Breast/Nipple): Filling, red/small blisters or bruises, mild/mod discomfort  Problem noted:  (positional strips  noted on both , permom nipples better )  Hold (Positioning): No assistance needed to correctly position infant at breast. Intervention(s): Breastfeeding basics reviewed  LATCH Score: 9  Lactation Tools Discussed/Used Tools: Shells;Pump;Other (comment) (curved tip with instructions to instill EBM in the top ) Nipple shield size: 24;Other (comment) (per mom still fets well ) Shell Type: Inverted Breast pump type: Manual   Consult Status Consult Status: Follow-up Date: 04/13/17 (at 11;30 am , appt reminder given to mom ) Follow-up type: Out-patient    Jean [name] [name], 10:28 AM

## 2017-04-07 NOTE — Progress Notes (Signed)
Patient ID: Jean MayoJennifer Mccardle, female   DOB: 04/13/1981, 36 y.o.   MRN: [id number] PPD #2 Doing well Afeb, VSS D/c home

## 2017-04-07 NOTE — Discharge Instructions (Signed)
As per discharge pamphlet °

## 2017-04-12 ENCOUNTER — Inpatient Hospital Stay (HOSPITAL_COMMUNITY): Admission: RE | Admit: 2017-04-12 | Payer: BLUE CROSS/BLUE SHIELD | Source: Ambulatory Visit

## 2017-04-13 ENCOUNTER — Ambulatory Visit: Payer: Self-pay

## 2017-04-13 NOTE — Lactation Note (Signed)
This note was copied from a baby's chart. 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: 8# 7oz Today's weight: 3860g (about 8# 8.3) ________________________________________________________________________  Mother's Name: Jean GripMichaela Rose [name] Type of delivery:  Vaginal, Spontaneous Delivery Breastfeeding Experience: primip Maternal Medical Conditions:  H/o depression Maternal Medications: PNV  ________________________________________________________________________  Breastfeeding History (Post Discharge)  Frequency of breastfeeding: 7 times/day Duration of feeding:  20-7430min  Pumping  Type of pump:  Medela pump in style Frequency: bid-tid  Volume:  4 ozml  Bottle of pumped milk: drained 3 oz Nuk "Simply Natural"  Infant Intake and Output Assessment  Voids:  5 in 24 hrs.  Color:  Clear yellow Stools:  6 in 24 hrs.  Color:  Yellow  ________________________________________________________________________  Maternal Breast Assessment  Breast:  Compressible Nipple:  Erect. Had position stripes while in hospital, using shells and coconut oil.  She reports that her nipples feel much better.  Feeding Assessment/Evaluation  Initial feeding assessment:  Infant's oral assessment:  WNL. Good elevation & extension of tongue noted.   Attached assessment:  Shallow, but can get deep if chin lowered  Lips flanged:  Yes.    Lips untucked:  Yes.    Suck assessment:  Displays both  Tools:  Nipple shield 24 mm Instructed on use and cleaning of tool:  Yes.    Pre-feed weight: 3878 g  (8 lb. 8.8 oz.) Post-feed weight:  3914 g  Amount transferred:  36 ml in  31 min R breast, cross-cradle, with nipple shield  Pre-feed weight: 3914 g   Post-feed weight: 3926g Amount transferred: 12 ml in 7 min L breast, no nipple shield  Total amount transferred: 48 ml  Infant is 908 days old and is 3.7% below BW. Infant has gained about 1 oz+ since weight yesterday. Infant has been feeding with a nipple shield since discharge. Infant was observed to latch on R breast with nipple shield; Mom needed some assistance with better application of nipple shield & holding of infant's hands. Mom was comfortable w/latch. Infant was able to latch to L breast without nipple shield & Mom had no discomfort.   Mom has no breast complaints.   Mom's goal is to try to have Jean latch to the bare breast. This & a multitude of other breastfeeding-related questions were answered. Mom to f/u as desired.  Glenetta HewKim Eoghan Belcher, RN, IBCLC

## 2017-04-20 ENCOUNTER — Telehealth (HOSPITAL_COMMUNITY): Payer: Self-pay | Admitting: Lactation Services

## 2017-04-20 NOTE — Telephone Encounter (Signed)
Mom called with concerns.  She was seen on 04/13/17 as an outpatient.  Parents took baby to the pediatricians today and the baby had lost 8 ounces.  Pediatrician told her to feed every 2 hours and give 1/2 ounce of supplement after feeds.  Instructed to post pump after day and evening feedings and give baby any milk obtained.  Mom states she has been pumping twice per day and obtaining 60 mls.  Milk is in the freezer.  Instructed mom to take milk out of freezer to use it daily.  Lactation outpatient appointment scheduled for Monday July 9 at 3:30 pm.

## 2017-04-23 ENCOUNTER — Ambulatory Visit (HOSPITAL_COMMUNITY)
Admission: RE | Admit: 2017-04-23 | Discharge: 2017-04-23 | Disposition: A | Discharge status: Home / Self Care | Payer: BLUE CROSS/BLUE SHIELD | Source: Ambulatory Visit | Attending: Pediatrics | Admitting: Pediatrics

## 2017-04-23 DIAGNOSIS — F9829 Other feeding disorders of infancy and early childhood: Secondary | ICD-10-CM | POA: Diagnosis not present

## 2017-04-23 NOTE — Lactation Note (Signed)
Lactation Consult  Mother's reason for visit:  Follow up, weight loss Visit Type:  Feeding assessment Appointment Notes:  Weight loss Consult:  Follow-Up Lactation Consultant:  Huston FoleyMOULDEN, Carlina Derks S  ________________________________________________________________________ Pecola LeisureBabyTrudee Grip: Michaela Rose DOB:[birth year] Birth weight:8-13.5 Discharge weight:8-5.3 Weight at pedi on 04/20/17: 8-0 Weight today: 8-13.7   ________________________________________________________________________  Mother's Name: Perlie MayoJennifer Weinfeld Type of delivery:  vaginal Breastfeeding Experience:  First time mom     ________________________________________________________________________  Breastfeeding History (Post Discharge)  Frequency of breastfeeding:  Every 3 hours Duration of feeding:  30+ minutes  Supplementation      Breastmilk:  Volume 30-60 ml Frequency:  Every 3 hours   Method:  Bottle,   Pumping  Type of pump:  Medela pump in style Frequency:  7 times in 24 hours Volume: 30-60 ml  Infant Intake and Output Assessment  Voids: 8  in 24 hrs.  Color:  Clear yellow Stools: 4-5  in 24 hrs.  Color:  Yellow  ________________________________________________________________________  Maternal Breast Assessment  Breast:  Soft Nipple:  Erect Pain level:  0 Pain interventions:  Bra and Expressed breast milk  _______________________________________________________________________ Feeding Assessment/Evaluation  Mom and 7918 day old baby here for follow up.  Baby had lost 8 ounces after appointment on 04/13/17.  Mom started post pumping and supplementing with 30-60 mls of expressed milk every 3 hours and baby gained 13.7 ounces in 3 days.  Parents state baby is always hungry after both breast and takes expressed milk eagerly.  Mom is very tired and spends an hour trying to breastfeed followed by bottle.  Baby is still not transferring full feeding at breast.  Plan is to feed with feeding cues using nipple shield  if necessary, limit feeding to 30 minutes on one breast, pump both breast after feeding and give baby 60-90 mls of expressed milk.  Okay to take a break at night and only pump and bottle feed.  Parents asking many good questions.  Questions answered and support given.  Follow up in 1 week to assess feeding and milk transfer.  Initial feeding assessment:  Infant's oral assessment:  Variance short lingual frenulum with some restriction with elevating tongue  Positioning:  Cross cradle Right breast  LATCH documentation:  Latch:  2 = Grasps breast easily, tongue down, lips flanged, rhythmical sucking.  Audible swallowing:  1 = A few with stimulation  Type of nipple:  2 = Everted at rest and after stimulation  Comfort (Breast/Nipple):  2 = Soft / non-tender  Hold (Positioning):  1 = Assistance needed to correctly position infant at breast and maintain latch  LATCH score:  8  Attached assessment:  Deep  Lips flanged:  Yes.    Lips untucked:  No.  Suck assessment:  Displays both  Tools:  Nipple shield 24 mm Instructed on use and cleaning of tool:  Yes.   Feeding 30 minutes.  Transfer all 26 mls the first 15 minutes without shield.  Baby would not go back on without shield so 24 mm nipple shield applied.  Baby sleepier at breast and no further transfer after 15 minutes with shield. Pre-feed weight: 4018  g   Post-feed weight: 4044  g  Amount transferred: 26  ml Amount supplemented:  60 ml       Total supplement given:  60 ml

## 2017-04-30 ENCOUNTER — Ambulatory Visit: Payer: Self-pay

## 2017-04-30 NOTE — Lactation Note (Signed)
This note was copied from a baby's chart. Lactation Consult  Mother's reason for visit:  Follow up Feeding assessment Visit Type:  Feeding assessment Appointment Notes: See below Consult:  Follow-Up Lactation Consultant:  Ed Blalock  ________________________________________________________________________  Jean [name] Name:  Jean [name] Date of Birth:  04/05/2015 Pediatrician:  Avis Epley Gender:  female Gestational Age: [redacted]w[redacted]d (At Birth) Birth Weight:  8 lb 13.5 oz (4010 g) Weight at Discharge:   8 lb 5.3 oz                       Date of Discharge:  04/07/17 There were no vitals filed for this visit. [facility]: 8 lb 13.7 oz  Location:Lactation visit 7/9 Weight today:  9 lb 12 oz 4426 grams with clean diaper  ________________________________________________________________________  Mother's Name: Jean [name] Type of delivery:  Vaginal, Spontaneous Delivery Breastfeeding Experience:  Has been able to stop using NS, is breast feeding and bottle feeding after BF  ________________________________________________________________________  Breastfeeding History (Post Discharge)  Frequency of breastfeeding:  Every 3-4 hours Duration of feeding: 10-20 minutes  Supplementation  Formula:  Volume 90ml Frequency: day Total volume per day:  90 ml        Breastmilk:  Volume 60-35ml Frequency:  4 x/day Total volume per day:  210 ml  Method:  Bottle,   Pumping  Type of pump:  Medela Free Style Frequency:  5-6 x a day for 20 minutes Volume:  1-5 oz  Infant Intake and Output Assessment  Voids:  9 in 24 hrs.  Color:  Clear yellow Stools:  4 in 24 hrs.  Color:  Yellow  ________________________________________________________________________  Maternal Breast Assessment  Breast:  Soft and Compressible Nipple:  Erect Pain level:  1, with initial latch, nipples have healed  Pain interventions:  Coconut  oil  _______________________________________________________________________ Feeding Assessment/Evaluation  Initial feeding assessment:  Infant's oral assessment:  Variance- infant is noted to have a short lingual frenulum with limites elevation of tongue to roof of mouth. She has good tongue extension and forms a good seal while feeding and suckling on gloved finger. She is noted to hump tongue in the back when suckling on gloved finger.  Positioning:  Cross cradle Right breast  LATCH documentation:  Latch:  2 = Grasps breast easily, tongue down, lips flanged, rhythmical sucking.  Audible swallowing:  2 = Spontaneous and intermittent  Type of nipple:  2 = Everted at rest and after stimulation  Comfort (Breast/Nipple):  2 = Soft / non-tender  Hold (Positioning):  2 = No assistance needed to correctly position infant at breast  LATCH score:  10  Attached assessment:  Deep  Lips flanged:  Yes.    Lips untucked:  No.  Suck assessment:  Displays both  Tools:  Weaned off NS 1 week ago   Pre-feed weight:  4426 g  (9 lb. 12 oz.) Post-feed weight:  4480 g (9 lb. 14 oz.) Amount transferred:  54 ml Amount supplemented:  0 ml  Additional Feeding Assessment -   Infant's oral assessment:  Variance-see above  Positioning:  Cross cradle Left breast  LATCH documentation:  Latch:  2 = Grasps breast easily, tongue down, lips flanged, rhythmical sucking.  Audible swallowing:  2 = Spontaneous and intermittent  Type of nipple:  2 = Everted at rest and after stimulation  Comfort (Breast/Nipple):  2 = Soft / non-tender  Hold (Positioning):  2 = No  assistance needed to correctly position infant at breast  LATCH score:  10  Attached assessment:  Deep  Lips flanged:  Yes.    Lips untucked:  No.  Suck assessment:  Displays both  Pre-feed weight:  4480 g  (9 lb. 14 oz.) Post-feed weight:  4516 g (9 lb. 15.3 oz.) Amount transferred:  36 ml Amount supplemented:  0 ml    Total amount  transferred:  90 ml Total supplement given:  0 ml  Follow up with parents of 273 week old infant, Jean [name]. Mom reports she is breast feeding infant about every 3-4 hours and awakening more frequently during the night. They are offering 2 oz EBM in a bottle post BF and using formula as needed. Parents report they are using one breast per feeding and not feeding longer than 30 minutes. They report infant needs to be held upright for about 20 minutes after each feeding due to spitting. Feedings often take an hour to complete.   Infant is noted to have some tongue restriction although she has improved her feedings and milk transfer. Infant with good extension of tongue, however her elevation is limited. She does cup the tongue well on gloved finger and forms a good seal. She is noted to hump back of tongue slightly with suckling. Mom reports she stopped using a NS about a week ago. She is experiencing some pain with initial latch that does diminish as feeding continues. Her nipples have healed, she is still using coconut oil to nipples. Parents have called Dr. Marcos Ekehane Hisaw to schedule and appt to have tongue evaluated, they are awaiting a call back from the office as they were on vacation last week. Dad is very concerned that a tongue restriction may cause speech problems in the future, advised them to speak to Dr. Lexine BatonHIsaw.   Mom is pumping and using a Medela Free Style. She is having some difficulty with her pump functioning properly and making loud noises, advised her to call Medela.   Jean [name] fed on the right breast and transferred 54 ml in 15 minutes. She then woke up about 5 minutes later and was offered the left breast and transferred an additional 36 ml in 10 minutes. Mom was independent with feeding. Infant had wet burp after feeding. Infant was very laid back with feeding, enc mom to stimulate infant as needed with feeding and to use breast compression to maximize milk transfer.   We reviewed that  formula can be stopped and to use EBM if infant asking for supplement or mom wanting break from breast feeding. Advised mom to offer both breasts with each feeding, emptying one side completely before offering 2nd breast. Advised that infant will most likely wish to feed more often at the breast as supplement is weaned. Advised parents to wean supplement slowly and to offer more breast time. Discussed attempting to feed her more often during the day and she tends to be up more at night currently. Reviewed mom can continue to pump daily if she desires to store milk if Henderson NewcomerMichaela does not need it. Reviewed monitoring for output when weaning supplement.  [facility]. Aslo encouraged mom to bring Jean [name] to Support Group for weight checks. Parents had many questions that were answered. They were very concerned they are overfeeding Jean [name] due to weight gain in the last week. Reassurance given. Henderson NewcomerMichaela has follow up Ped appt 8/17. Parents without further questions/concerns at this time.  Plan was made and written copy given to parents: Continue to breat feed 8-12 x a day at first feeding cues Offer both breasts with each feeding Try to feed more often during the day Pump 4 x/day and then wean off pumping slowly by dropping one pumping every 2-3 days Continue to offer supplement of breast milk via bottle if still hungry after offering both breasts with the feeding Call for assistance as needed Keep up the good work!!

## 2017-05-23 DIAGNOSIS — Z3009 Encounter for other general counseling and advice on contraception: Secondary | ICD-10-CM | POA: Diagnosis not present

## 2017-05-23 DIAGNOSIS — Z1389 Encounter for screening for other disorder: Secondary | ICD-10-CM | POA: Diagnosis not present

## 2018-01-29 ENCOUNTER — Emergency Department
Admission: EM | Admit: 2018-01-29 | Discharge: 2018-01-29 | Disposition: A | Discharge status: Home / Self Care | Payer: BLUE CROSS/BLUE SHIELD | Attending: Emergency Medicine | Admitting: Emergency Medicine

## 2018-01-29 ENCOUNTER — Emergency Department: Payer: BLUE CROSS/BLUE SHIELD

## 2018-01-29 DIAGNOSIS — R55 Syncope and collapse: Secondary | ICD-10-CM | POA: Diagnosis not present

## 2018-01-29 DIAGNOSIS — F329 Major depressive disorder, single episode, unspecified: Secondary | ICD-10-CM | POA: Insufficient documentation

## 2018-01-29 DIAGNOSIS — Z79899 Other long term (current) drug therapy: Secondary | ICD-10-CM | POA: Insufficient documentation

## 2018-01-29 DIAGNOSIS — R011 Cardiac murmur, unspecified: Secondary | ICD-10-CM | POA: Diagnosis not present

## 2018-01-29 DIAGNOSIS — R42 Dizziness and giddiness: Secondary | ICD-10-CM | POA: Diagnosis not present

## 2018-01-29 LAB — URINALYSIS, COMPLETE (UACMP) WITH MICROSCOPIC
BACTERIA UA: NONE SEEN
BILIRUBIN URINE: NEGATIVE
Glucose, UA: NEGATIVE mg/dL
Hgb urine dipstick: NEGATIVE
Ketones, ur: NEGATIVE mg/dL
Leukocytes, UA: NEGATIVE
Nitrite: NEGATIVE
Protein, ur: NEGATIVE mg/dL
SPECIFIC GRAVITY, URINE: 1.018 (ref 1.005–1.030)
pH: 6 (ref 5.0–8.0)

## 2018-01-29 LAB — CBC WITH DIFFERENTIAL/PLATELET
Basophils Absolute: 0 10*3/uL (ref 0–0.1)
Basophils Relative: 0 %
EOS ABS: 0.1 10*3/uL (ref 0–0.7)
EOS PCT: 0 %
HCT: 39.7 % (ref 35.0–47.0)
Hemoglobin: 13.5 g/dL (ref 12.0–16.0)
LYMPHS ABS: 1.8 10*3/uL (ref 1.0–3.6)
LYMPHS PCT: 12 %
MCH: 30 pg (ref 26.0–34.0)
MCHC: 34 g/dL (ref 32.0–36.0)
MCV: 88.2 fL (ref 80.0–100.0)
MONO ABS: 1.2 10*3/uL — AB (ref 0.2–0.9)
Monocytes Relative: 7 %
Neutro Abs: 12.5 10*3/uL — ABNORMAL HIGH (ref 1.4–6.5)
Neutrophils Relative %: 81 %
PLATELETS: 288 10*3/uL (ref 150–440)
RBC: 4.5 MIL/uL (ref 3.80–5.20)
RDW: 13.1 % (ref 11.5–14.5)
WBC: 15.6 10*3/uL — ABNORMAL HIGH (ref 3.6–11.0)

## 2018-01-29 LAB — BASIC METABOLIC PANEL
Anion gap: 9 (ref 5–15)
BUN: 14 mg/dL (ref 6–20)
CALCIUM: 8.9 mg/dL (ref 8.9–10.3)
CO2: 24 mmol/L (ref 22–32)
CREATININE: 0.64 mg/dL (ref 0.44–1.00)
Chloride: 104 mmol/L (ref 101–111)
GFR calc Af Amer: >60 mL/min (ref >60)
GLUCOSE: 118 mg/dL — AB (ref 65–99)
Potassium: 3.2 mmol/L — ABNORMAL LOW (ref 3.5–5.1)
SODIUM: 137 mmol/L (ref 135–145)

## 2018-01-29 LAB — PREGNANCY, URINE: PREG TEST UR: NEGATIVE

## 2018-01-29 LAB — TROPONIN I: Troponin I: <0.03 ng/mL (ref <0.03)

## 2018-01-29 MED ORDER — SODIUM CHLORIDE 0.9 % IV BOLUS
1000.0000 mL | Freq: Once | INTRAVENOUS | Status: AC
  Administered 2018-01-29: 1000 mL via INTRAVENOUS

## 2018-01-29 NOTE — ED Triage Notes (Signed)
Patient to ED via [facility] c/o near syncope. Per EMS patient was walking to her car and had several near syncopal events and reports she felt dizzy, lightheaded, and diaphoretic. She is now alert and oriented x4. No acute distress noted.

## 2018-01-29 NOTE — ED Provider Notes (Signed)
[facility] Emergency Department Provider Note  ___________________________________________   First MD Initiated Contact with Patient 01/29/18 1558     (approximate)  I have reviewed the triage vital signs and the nursing notes.   HISTORY  Chief Complaint Near Syncope   HPI Jean [name] is a 37 y.o. female without any chronic medical conditions was presenting to the emergency department today after near syncopal episode.  She says that she was walking to her car when she began to feel nauseous and hot.  She says that she also began to feel lightheaded and then when sitting in her car put her seat back all the way and then turn the air conditioning on "full blast."  She says that she texted her coworker to say that "she needed to get checked out."  EMS was called and patient's first blood pressure was taken and found to be 80/60.  Patient says that since this incident that she is feeling improved.  Denying any pain.  However, she does state that she has "discomfort" sometimes in her mid to low back wrapping around to the anterior which she says has been an ongoing issue for her due to sitting for long time at work.  She says that she has a NuvaRing.  Denying any chest pain, shortness of breath.  Says that in the past she has episodes where she has almost passed out.  However, she says never for this longer to the severity of her symptoms.  She says that she has had these episodes in the past when she has skipped a meal.  However, she denies skipping a meal today.  She says that she has about 1 glass of wine per day.  No drinking or drug use.  No sudden death in close relatives.  No history of family members with coronary artery disease at young age.   Past Medical History:  Diagnosis Date  . Depression     Patient Active Problem List   Diagnosis Date Noted  . Normal labor 04/05/2017  . NSVD (normal spontaneous vaginal delivery) 04/05/2017    History  reviewed. No pertinent surgical history.  Prior to Admission medications   Medication Sig Start Date End Date Taking? Authorizing Provider  cetirizine (ZYRTEC) 10 MG tablet Take 10 mg by mouth daily.   Yes [provider]  NUVARING 0.12-0.015 MG/24HR vaginal ring Place 1 each vaginally every 28 (twenty-eight) days. Insert for 3 weeks (21 days) and remove for 1 week break (7 days). 12/12/17  Yes [provider]  sertraline (ZOLOFT) 25 MG tablet Take 25 mg by mouth daily. 01/20/18  Yes [provider]  ibuprofen (ADVIL,MOTRIN) 600 MG tablet Take 1 tablet (600 mg total) by mouth every 6 (six) hours. Patient not taking: Reported on 01/29/2018 04/07/17   Lavina Hamman, MD    Allergies Patient has no known allergies.  History reviewed. No pertinent family history.  Social History Social History   Tobacco Use  . Smoking status: Never Smoker  . Smokeless tobacco: Never Used  Substance Use Topics  . Alcohol use: No  . Drug use: No    Review of Systems  Constitutional: No fever/chills Eyes: No visual changes. ENT: No sore throat. Cardiovascular: Denies chest pain. Respiratory: Denies shortness of breath. Gastrointestinal: No abdominal pain.  No nausea, no vomiting.  No diarrhea.  No constipation. Genitourinary: Negative for dysuria. Musculoskeletal: Negative for back pain. Skin: Negative for rash. Neurological: Negative for headaches, focal weakness or numbness.  ____________________________________________  PHYSICAL EXAM:  VITAL SIGNS: ED Triage Vitals  Enc Vitals Group     BP 01/29/18 1539 138/71     Pulse Rate 01/29/18 1539 70     Resp 01/29/18 1539 12     Temp 01/29/18 1539 97.8 F (36.6 C)     Temp Source 01/29/18 1539 Oral     SpO2 01/29/18 1539 100 %     Weight 01/29/18 1544 195 lb (88.5 kg)     Height 01/29/18 1544 5\' 10"  (1.778 m)     Head Circumference --      Peak Flow --      Pain Score 01/29/18 1540 0     Pain Loc --      Pain  Edu? --      Excl. in GC? --     Constitutional: Alert and oriented. Well appearing and in no acute distress. Eyes: Conjunctivae are normal.  Head: Atraumatic. Nose: No congestion/rhinnorhea. Mouth/Throat: Mucous membranes are moist.  Neck: No stridor.   Cardiovascular: Normal rate, regular rhythm.  Very faint systolic ejection murmur heard best over the left sternal border. Respiratory: Normal respiratory effort.  No retractions. Lungs CTAB. Gastrointestinal: Soft and nontender. No distention. No CVA tenderness. Musculoskeletal: No lower extremity tenderness nor edema.  No joint effusions.  No tenderness to the thoracic nor lumbar spines.  No chest wall tenderness to palpation.   Neurologic:  Normal speech and language. No gross focal neurologic deficits are appreciated. Skin:  Skin is warm, dry and intact. No rash noted. Psychiatric: Mood and affect are normal. Speech and behavior are normal.  ____________________________________________   LABS (all labs ordered are listed, but only abnormal results are displayed)  Labs Reviewed  CBC WITH DIFFERENTIAL/PLATELET - Abnormal; Notable for the following components:      Result Value   WBC 15.6 (*)    Neutro Abs 12.5 (*)    Monocytes Absolute 1.2 (*)    All other components within normal limits  BASIC METABOLIC PANEL - Abnormal; Notable for the following components:   Potassium 3.2 (*)    Glucose, Bld 118 (*)    All other components within normal limits  URINALYSIS, COMPLETE (UACMP) WITH MICROSCOPIC - Abnormal; Notable for the following components:   Color, Urine YELLOW (*)    APPearance HAZY (*)    Squamous Epithelial / LPF 0-5 (*)    All other components within normal limits  TROPONIN I  PREGNANCY, URINE  POC URINE PREG, ED   ____________________________________________  EKG  ED ECG REPORT I, Arelia Longest, the attending physician, personally viewed and interpreted this ECG.   Date: 01/29/2018  EKG Time: 1536   Rate: 70  Rhythm: normal sinus rhythm  Axis: Normal  Intervals:nonspecific intraventricular conduction delay  ST&T Change: No ST segment elevation or depression.  No abnormal T wave inversion.  ____________________________________________  RADIOLOGY  No acute abnormalities on the chest x-ray ____________________________________________   PROCEDURES  Procedure(s) performed:   Procedures  Critical Care performed:   ____________________________________________   INITIAL IMPRESSION / ASSESSMENT AND PLAN / ED COURSE  Pertinent labs & imaging results that were available during my care of the patient were reviewed by me and considered in my medical decision making (see chart for details).  DDX: Near syncope/vasovagal episode, nausea, dehydration, likely abnormality, UTI, pregnancy As part of my medical decision making, I reviewed the following data within the electronic MEDICAL RECORD NUMBER Notes from prior records  Unlikely to be PE.  Symptoms resolved.  -----------------------------------------  5:57 PM on 01/29/2018 -----------------------------------------  Patient at this time without any complaints.  No pain.  No shortness of breath.  Says that she feels much improved from previous after fluids.  Likely vasovagal episode causing near syncope.  Also murmur detected of unclear clinical significance.  I do not believe the patient will need to stay for any further testing or observation.  She is understanding of this plan and willing to comply but will be discharged at this time. ____________________________________________   FINAL CLINICAL IMPRESSION(S) / ED DIAGNOSES  Near syncope.    NEW MEDICATIONS STARTED DURING THIS VISIT:  New Prescriptions   No medications on file     Note:  This document was prepared using Dragon voice recognition software and may include unintentional dictation errors.     Myrna BlazerSchaevitz, David Matthew, MD 01/29/18 [id number]

## 2018-03-01 ENCOUNTER — Ambulatory Visit: Payer: Self-pay | Admitting: Adult Health

## 2018-03-01 ENCOUNTER — Encounter: Payer: Self-pay | Admitting: Adult Health

## 2018-03-01 VITALS — BP 148/85 | HR 90 | Temp 99.5°F | Resp 16 | Ht 69.0 in | Wt 201.0 lb

## 2018-03-01 DIAGNOSIS — J0110 Acute frontal sinusitis, unspecified: Secondary | ICD-10-CM

## 2018-03-01 DIAGNOSIS — H66002 Acute suppurative otitis media without spontaneous rupture of ear drum, left ear: Secondary | ICD-10-CM

## 2018-03-01 DIAGNOSIS — F32A Depression, unspecified: Secondary | ICD-10-CM | POA: Insufficient documentation

## 2018-03-01 DIAGNOSIS — F320 Major depressive disorder, single episode, mild: Secondary | ICD-10-CM | POA: Insufficient documentation

## 2018-03-01 MED ORDER — FLUTICASONE PROPIONATE 50 MCG/ACT NA SUSP: 2.0000 | Freq: Every day | NASAL | 6 refills | Status: DC

## 2018-03-01 MED ORDER — AMOXICILLIN-POT CLAVULANATE 875-125 MG PO TABS: 1.0000 | ORAL_TABLET | Freq: Two times a day (BID) | ORAL | 0 refills | Status: DC

## 2018-03-01 NOTE — Patient Instructions (Addendum)
Sinusitis, Adult Sinusitis is soreness and inflammation of your sinuses. Sinuses are hollow spaces in the bones around your face. They are located:  Around your eyes.  In the middle of your forehead.  Behind your nose.  In your cheekbones.  Your sinuses and nasal passages are lined with a stringy fluid (mucus). Mucus normally drains out of your sinuses. When your nasal tissues get inflamed or swollen, the mucus can get trapped or blocked so air cannot flow through your sinuses. This lets bacteria, viruses, and funguses grow, and that leads to infection. Follow these instructions at home: Medicines  Take, use, or apply over-the-counter and prescription medicines only as told by your doctor. These may include nasal sprays.  If you were prescribed an antibiotic medicine, take it as told by your doctor. Do not stop taking the antibiotic even if you start to feel better. Hydrate and Humidify  Drink enough water to keep your pee (urine) clear or pale yellow.  Use a cool mist humidifier to keep the humidity level in your home above 50%.  Breathe in steam for 10-15 minutes, 3-4 times a day or as told by your doctor. You can do this in the bathroom while a hot shower is running.  Try not to spend time in cool or dry air. Rest  Rest as much as possible.  Sleep with your head raised (elevated).  Make sure to get enough sleep each night. General instructions  Put a warm, moist washcloth on your face 3-4 times a day or as told by your doctor. This will help with discomfort.  Wash your hands often with soap and water. If there is no soap and water, use hand sanitizer.  Do not smoke. Avoid being around people who are smoking (secondhand smoke).  Keep all follow-up visits as told by your doctor. This is important. Contact a doctor if:  You have a fever.  Your symptoms get worse.  Your symptoms do not get better within 10 days. Get help right away if:  You have a very bad  headache.  You cannot stop throwing up (vomiting).  You have pain or swelling around your face or eyes.  You have trouble seeing.  You feel confused.  Your neck is stiff.  You have trouble breathing. This information is not intended to replace advice given to you by your health care provider. Make sure you discuss any questions you have with your health care provider. Document Released: 03/20/2008 Document Revised: 05/28/2016 Document Reviewed: 07/28/2015 Elsevier Interactive Patient Education  2018 [employer] Fluticasone nasal spray What is this medicine? FLUTICASONE (floo TIK a sone) is a corticosteroid. This medicine is used to treat the symptoms of allergies like sneezing, itchy red eyes, and itchy, runny, or stuffy nose. This medicine is also used to treat nasal polyps. This medicine may be used for other purposes; ask your health care provider or pharmacist if you have questions. COMMON BRAND NAME(S): Flonase, Flonase Allergy Relief, Flonase Sensimist, Veramyst, XHANCE What should I tell my health care provider before I take this medicine? They need to know if you have any of these conditions: -cataracts -glaucoma -infection, like tuberculosis, herpes, or fungal infection -recent surgery on nose or sinuses -taking a corticosteroid by mouth -an unusual or allergic reaction to fluticasone, steroids, other medicines, foods, dyes, or preservatives -pregnant or trying to get pregnant -breast-feeding How should I use this medicine? This medicine is for use in the nose. Follow the directions on your product or prescription label.  This medicine works best if used at regular intervals. Do not use more often than directed. Make sure that you are using your nasal spray correctly. After 6 months of daily use for allergies, talk to your doctor or health care professional before using it for a longer time. Ask your doctor or health care professional if you have any questions. Talk to  your pediatrician regarding the use of this medicine in children. Special care may be needed. Some products have been used for allergies in children as young as 2 years. After 2 months of daily use without a prescription in a child, talk to your pediatrician before using it for a longer time. Use of this medicine for nasal polyps is not approved in children. Overdosage: If you think you have taken too much of this medicine contact a poison control center or emergency room at once. NOTE: This medicine is only for you. Do not share this medicine with others. What if I miss a dose? If you miss a dose, use it as soon as you remember. If it is almost time for your next dose, use only that dose and continue with your regular schedule. Do not use double or extra doses. What may interact with this medicine? -certain antibiotics like clarithromycin and telithromycin -certain medicines for fungal infections like ketoconazole, itraconazole, and voriconazole -conivaptan -nefazodone -some medicines for HIV -vaccines This list may not describe all possible interactions. Give your health care provider a list of all the medicines, herbs, non-prescription drugs, or dietary supplements you use. Also tell them if you smoke, drink alcohol, or use illegal drugs. Some items may interact with your medicine. What should I watch for while using this medicine? Visit your doctor or health care professional for regular checks on your progress. Some symptoms may improve within 12 hours after starting use. Check with your doctor or health care professional if there is no improvement in your symptoms after 3 weeks of use. This medicine may increase your risk of getting an infection. Tell your doctor or health care professional if you are around anyone with measles or chickenpox, or if you develop sores or blisters that do not heal properly. What side effects may I notice from receiving this medicine? Side effects that you should  report to your doctor or health care professional as soon as possible: -allergic reactions like skin rash, itching or hives, swelling of the face, lips, or tongue -changes in vision -crusting or sores in the nose -nosebleed -signs and symptoms of infection like fever or chills; cough; sore throat -white patches or sores in the mouth or nose Side effects that usually do not require medical attention (report to your doctor or health care professional if they continue or are bothersome): -burning or irritation inside the nose or throat -cough -headache -unusual taste or smell This list may not describe all possible side effects. Call your doctor for medical advice about side effects. You may report side effects to FDA at 1-800-FDA-1088. Where should I keep my medicine? Keep out of the reach of children. Store at room temperature between 15 and 30 degrees C (59 and 86 degrees F). Avoid exposure to extreme heat, cold, or light. Throw away any unused medicine after the expiration date. NOTE: This sheet is a summary. It may not cover all possible information. If you have questions about this medicine, talk to your doctor, pharmacist, or health care provider.  2018 Elsevier/Gold Standard (2016-07-14 14:23:12) Otitis Media, Adult Otitis media is redness, soreness,  and puffiness (swelling) in the space just behind your eardrum (middle ear). It may be caused by allergies or infection. It often happens along with a cold. Follow these instructions at home:  Take your medicine as told. Finish it even if you start to feel better.  Only take over-the-counter or prescription medicines for pain, discomfort, or fever as told by your doctor.  Follow up with your doctor as told. Contact a doctor if:  You have otitis media only in one ear, or bleeding from your nose, or both.  You notice a lump on your neck.  You are not getting better in 3-5 days.  You feel worse instead of better. Get help right  away if:  You have pain that is not helped with medicine.  You have puffiness, redness, or pain around your ear.  You get a stiff neck.  You cannot move part of your face (paralysis).  You notice that the bone behind your ear hurts when you touch it. This information is not intended to replace advice given to you by your health care provider. Make sure you discuss any questions you have with your health care provider. Document Released: 03/20/2008 Document Revised: 03/09/2016 Document Reviewed: 04/29/2013 Elsevier Interactive Patient Education  2017 [employer] Inc. Amoxicillin; Clavulanic Acid tablets What is this medicine? AMOXICILLIN; CLAVULANIC ACID (a mox i SIL in; KLAV yoo lan ic AS id) is a penicillin antibiotic. It is used to treat certain kinds of bacterial infections. It will not work for colds, flu, or other viral infections. This medicine may be used for other purposes; ask your health care provider or pharmacist if you have questions. COMMON BRAND NAME(S): Augmentin What should I tell my health care provider before I take this medicine? They need to know if you have any of these conditions: -bowel disease, like colitis -kidney disease -liver disease -mononucleosis -an unusual or allergic reaction to amoxicillin, penicillin, cephalosporin, other antibiotics, clavulanic acid, other medicines, foods, dyes, or preservatives -pregnant or trying to get pregnant -breast-feeding How should I use this medicine? Take this medicine by mouth with a full glass of water. Follow the directions on the prescription label. Take at the start of a meal. Do not crush or chew. If the tablet has a score line, you may cut it in half at the score line for easier swallowing. Take your medicine at regular intervals. Do not take your medicine more often than directed. Take all of your medicine as directed even if you think you are better. Do not skip doses or stop your medicine early. Talk to your  pediatrician regarding the use of this medicine in children. Special care may be needed. Overdosage: If you think you have taken too much of this medicine contact a poison control center or emergency room at once. NOTE: This medicine is only for you. Do not share this medicine with others. What if I miss a dose? If you miss a dose, take it as soon as you can. If it is almost time for your next dose, take only that dose. Do not take double or extra doses. What may interact with this medicine? -allopurinol -anticoagulants -birth control pills -methotrexate -probenecid This list may not describe all possible interactions. Give your health care provider a list of all the medicines, herbs, non-prescription drugs, or dietary supplements you use. Also tell them if you smoke, drink alcohol, or use illegal drugs. Some items may interact with your medicine. What should I watch for while using this medicine?  Tell your doctor or health care professional if your symptoms do not improve. Do not treat diarrhea with over the counter products. Contact your doctor if you have diarrhea that lasts more than 2 days or if it is severe and watery. If you have diabetes, you may get a false-positive result for sugar in your urine. Check with your doctor or health care professional. Birth control pills may not work properly while you are taking this medicine. Talk to your doctor about using an extra method of birth control. What side effects may I notice from receiving this medicine? Side effects that you should report to your doctor or health care professional as soon as possible: -allergic reactions like skin rash, itching or hives, swelling of the face, lips, or tongue -breathing problems -dark urine -fever or chills, sore throat -redness, blistering, peeling or loosening of the skin, including inside the mouth -seizures -trouble passing urine or change in the amount of urine -unusual bleeding,  bruising -unusually weak or tired -white patches or sores in the mouth or throat Side effects that usually do not require medical attention (report to your doctor or health care professional if they continue or are bothersome): -diarrhea -dizziness -headache -nausea, vomiting -stomach upset -vaginal or anal irritation This list may not describe all possible side effects. Call your doctor for medical advice about side effects. You may report side effects to FDA at 1-800-FDA-1088. Where should I keep my medicine? Keep out of the reach of children. Store at room temperature below 25 degrees C (77 degrees F). Keep container tightly closed. Throw away any unused medicine after the expiration date. NOTE: This sheet is a summary. It may not cover all possible information. If you have questions about this medicine, talk to your doctor, pharmacist, or health care provider.  2018 Elsevier/Gold Standard (2007-12-26 12:04:30)

## 2018-03-01 NOTE — Progress Notes (Signed)
Subjective:     Patient ID: Jean [name], female   DOB: Jan 01, 1981, 37 y.o.   MRN: [id number]  HPI  Blood pressure (!) 148/85, pulse 90, temperature 99.5 F (37.5 C), resp. rate 16, height  (1.753 m), weight 201 lb (91.2 kg), last menstrual period 02/13/2018, SpO2 98 %, unknown if currently breastfeeding.  Patient is a 37 year old female in no acute distress who comes in with a congested cough, sinus pressure in forehead and ears. She reports she has had these symptoms since last Wednesday.  Degongestant last night.  Zyrtec she is taking everyday.  She has a 53 month old who has been sick in daycare.   Febrile in office,  Patient  denies any  body aches,chills, rash, chest pain, shortness of breath, nausea, vomiting, or diarrhea.    Review of Systems  Constitutional: Positive for fatigue. Negative for activity change, appetite change, chills, diaphoresis, fever and unexpected weight change.  HENT: Positive for ear pain (pressure in ears ), postnasal drip, rhinorrhea and sinus pressure. Negative for sinus pain, sneezing, sore throat, tinnitus, trouble swallowing and voice change.   Eyes: Negative.   Respiratory: Positive for cough. Negative for apnea, choking, chest tightness, shortness of breath, wheezing and stridor.   Cardiovascular: Negative.   Gastrointestinal: Negative.   Endocrine: Negative.   Genitourinary: Negative.   Musculoskeletal: Negative.   Skin: Negative.   Neurological: Negative.   Hematological: Negative.   Psychiatric/Behavioral: Negative.        Objective:   Physical Exam  Constitutional: She is oriented to person, place, and time. Vital signs are normal. She appears well-developed and well-nourished. She is active.  Non-toxic appearance. She does not have a sickly appearance. She does not appear ill. No distress.  Patient is alert and oriented and responsive to questions Engages in eye contact with provider. Speaks in full sentences without any  pauses without any shortness of breath.    HENT:  Head: Normocephalic and atraumatic.  Right Ear: Hearing, external ear and ear canal normal. A middle ear effusion is present.  Left Ear: Hearing, external ear and ear canal normal. Tympanic membrane is erythematous. Tympanic membrane is not perforated. A middle ear effusion (darker brown fluid left ear ) is present.  Nose: Mucosal edema and rhinorrhea present. Right sinus exhibits frontal sinus tenderness. Left sinus exhibits frontal sinus tenderness.  Mouth/Throat: Uvula is midline and mucous membranes are normal. Posterior oropharyngeal erythema present. No oropharyngeal exudate, posterior oropharyngeal edema or tonsillar abscesses. Tonsils are 1+ on the right. Tonsils are 1+ on the left. No tonsillar exudate.   Cobblestoning posterior pharynx; bilateral allergic shiners; bilateral nasal turbinates mild edema erythema clear discharge;     Eyes: Pupils are equal, round, and reactive to light. Conjunctivae and EOM are normal. Right eye exhibits no discharge. Left eye exhibits no discharge. No scleral icterus.  Neck: Normal range of motion. Neck supple. No JVD present. No tracheal deviation present.  Cardiovascular: Normal rate, regular rhythm, normal heart sounds and intact distal pulses. Exam reveals no gallop and no friction rub.  No murmur heard. Pulmonary/Chest: Effort normal and breath sounds normal. No stridor. No respiratory distress. She has no wheezes. She has no rales. She exhibits no tenderness.  Abdominal: Soft. Bowel sounds are normal.  Musculoskeletal: Normal range of motion.  Patient moves on and off of exam table and in room without difficulty. Gait is normal in hall and in room. Patient is oriented to person place time and situation. Patient answers  questions appropriately and engages in conversation.   Lymphadenopathy:       Head (right side): No submental, no submandibular, no tonsillar, no preauricular, no posterior auricular  and no occipital adenopathy present.       Head (left side): No submental, no submandibular, no tonsillar, no preauricular, no posterior auricular and no occipital adenopathy present.    She has no cervical adenopathy.       Right cervical: No superficial cervical, no deep cervical and no posterior cervical adenopathy present.      Left cervical: No superficial cervical, no deep cervical and no posterior cervical adenopathy present.  Neurological: She is alert and oriented to person, place, and time. She has normal strength. She displays normal reflexes. No cranial nerve deficit. She exhibits normal muscle tone. She displays a negative Romberg sign. Coordination and gait normal.  Skin: Skin is warm, dry and intact. Capillary refill takes less than 2 seconds. No rash noted. She is not diaphoretic. No erythema. No pallor.  Psychiatric: She has a normal mood and affect. Her behavior is normal. Judgment and thought content normal.  Vitals reviewed.      Assessment:     Acute non-recurrent frontal sinusitis  Non-recurrent acute suppurative otitis media of left ear without spontaneous rupture of tympanic membrane      Plan:     Meds ordered this encounter  Medications  . amoxicillin-clavulanate (AUGMENTIN) 875-125 MG tablet    Sig: Take 1 tablet by mouth 2 (two) times daily.    Dispense:  20 tablet    Refill:  0  . fluticasone (FLONASE) 50 MCG/ACT nasal spray    Sig: Place 2 sprays into both nostrils daily.    Dispense:  16 g    Refill:  6    Advised patient call the office or your primary care doctor for an appointment if no improvement within 72 hours or if any symptoms change or worsen at any time  Advised ER or urgent Care if after hours or on weekend. Call 911 for emergency symptoms at any time.Patinet verbalized understanding of all instructions given/reviewed and treatment plan and has no further questions or concerns at this time.    Patient verbalized understanding of all  instructions given and denies any further questions at this time.

## 2018-03-05 ENCOUNTER — Ambulatory Visit: Payer: Self-pay | Admitting: Medical

## 2018-03-05 ENCOUNTER — Encounter: Payer: Self-pay | Admitting: Medical

## 2018-03-05 VITALS — BP 119/87 | HR 79 | Temp 98.9°F | Wt 199.4 lb

## 2018-03-05 DIAGNOSIS — R062 Wheezing: Secondary | ICD-10-CM

## 2018-03-05 DIAGNOSIS — H6502 Acute serous otitis media, left ear: Secondary | ICD-10-CM

## 2018-03-05 DIAGNOSIS — H6983 Other specified disorders of Eustachian tube, bilateral: Secondary | ICD-10-CM

## 2018-03-05 MED ORDER — PREDNISONE 10 MG (21) PO TBPK: ORAL_TABLET | ORAL | 0 refills | Status: DC

## 2018-03-05 MED ORDER — ALBUTEROL SULFATE HFA 108 (90 BASE) MCG/ACT IN AERS: 2.0000 | INHALATION_SPRAY | Freq: Four times a day (QID) | RESPIRATORY_TRACT | 0 refills | Status: DC | PRN

## 2018-03-05 NOTE — Progress Notes (Signed)
Subjective:    Patient ID: Jean [name], female    DOB: 07/12/1981, 37 y.o.   MRN: [id number]  HPI 37 yo female in non acute distress,  Friday started on Augmentin and Flonase, already on Zyrtec (on 3.5 days), feeling a little bit better. Took off Thursday , Friday and 1/2 of Monday due to not feeling well. Pressure in ears and cough, having coughing fits still productive a little productive yellow..  Denies fever or chills, denies shortness of breath , chest tightness, chest pain.   Not breast feeding she does have an 3month old child.  Blood pressure 119/87, pulse 79, temperature 98.9 F (37.2 C), weight 199 lb 6.4 oz (90.4 kg), last menstrual period 02/13/2018, SpO2 99 %, unknown if currently breastfeeding.   Review of Systems  Constitutional: Positive for fatigue. Negative for chills and fever.  HENT: Positive for congestion (better) and sinus pressure (forehead and near nose on the side). Negative for ear discharge, ear pain, hearing loss, rhinorrhea, sneezing, sore throat and voice change.   Eyes: Negative for discharge and itching.  Respiratory: Positive for cough and wheezing (a couple of times). Negative for chest tightness and shortness of breath.   Cardiovascular: Negative for chest pain, palpitations and leg swelling.  Gastrointestinal: Negative for abdominal pain.  Endocrine: Negative for polydipsia and polyphagia.  Genitourinary: Negative for dysuria.  Musculoskeletal: Negative for myalgias.  Skin: Negative for rash.  Allergic/Immunologic: Positive for environmental allergies. Negative for food allergies.  Neurological: Positive for headaches. Negative for dizziness, syncope and light-headedness.  Hematological: Negative for adenopathy. Does not bruise/bleed easily.  Psychiatric/Behavioral: Negative for behavioral problems, self-injury and suicidal ideas. The patient is not nervous/anxious.        Objective:   Physical Exam  Constitutional: She is oriented to  person, place, and time. She appears well-developed and well-nourished.  HENT:  Head: Normocephalic and atraumatic.  Right Ear: Hearing, external ear and ear canal normal. A middle ear effusion is present.  Left Ear: Hearing, external ear and ear canal normal. Tympanic membrane is erythematous. A middle ear effusion is present.  Eyes: Pupils are equal, round, and reactive to light. Conjunctivae and EOM are normal.  Neck: Normal range of motion. Neck supple.  Cardiovascular: Normal rate, regular rhythm and normal heart sounds.  Pulmonary/Chest: Effort normal and breath sounds normal.  Lymphadenopathy:    She has no cervical adenopathy.  Neurological: She is alert and oriented to person, place, and time.  Skin: Skin is warm and dry. Capillary refill takes less than 2 seconds.  Psychiatric: She has a normal mood and affect. Her behavior is normal. Judgment and thought content normal.  Nursing note and vitals reviewed.    Red ear on the left and dark on the right   mild  Turbinate edema on the  Left with erythema Cobblestoning posterior pharynx No cough noted in room .     Assessment & Plan:  Otitis media left/ Eustachian tube dysfunction Cough/ wheeze LLL inspiratory Meds ordered this encounter  Medications  . predniSONE (STERAPRED UNI-PAK 21 TAB) 10 MG (21) TBPK tablet    Sig: Take 6 tablets  By mouth  today then  5 tablets tomorrow  Then one tablet  less every day there after.    Dispense:  21 tablet    Refill:  0  . albuterol (PROVENTIL HFA;VENTOLIN HFA) 108 (90 Base) MCG/ACT inhaler    Sig: Inhale 2 puffs into the lungs every 6 (six) hours as needed for wheezing or  shortness of breath.    Dispense:  1 Inhaler    Refill:  0  Stop Sudafed , continue antibiotic Augmentin. Return by Friday if not improving.  Patient verbalizes understanding and has no questions at discharge. She knows that if she has pain in the ears to return to the clinic sooner or seek out medical care.

## 2018-03-05 NOTE — Patient Instructions (Signed)
Eustachian Tube Dysfunction The eustachian tube connects the middle ear to the back of the nose. It regulates air pressure in the middle ear by allowing air to move between the ear and nose. It also helps to drain fluid from the middle ear space. When the eustachian tube does not function properly, air pressure, fluid, or both can build up in the middle ear. Eustachian tube dysfunction can affect one or both ears. What are the causes? This condition happens when the eustachian tube becomes blocked or cannot open normally. This may result from:  Ear infections.  Colds and other upper respiratory infections.  Allergies.  Irritation, such as from cigarette smoke or acid from the stomach coming up into the esophagus (gastroesophageal reflux).  Sudden changes in air pressure, such as from descending in an airplane.  Abnormal growths in the nose or throat, such as nasal polyps, tumors, or enlarged tissue at the back of the throat (adenoids).  What increases the risk? This condition may be more likely to develop in people who smoke and people who are overweight. Eustachian tube dysfunction may also be more likely to develop in children, especially children who have:  Certain birth defects of the mouth, such as cleft palate.  Large tonsils and adenoids.  What are the signs or symptoms? Symptoms of this condition may include:  A feeling of fullness in the ear.  Ear pain.  Clicking or popping noises in the ear.  Ringing in the ear.  Hearing loss.  Loss of balance.  Symptoms may get worse when the air pressure around you changes, such as when you travel to an area of high elevation or fly on an airplane. How is this diagnosed? This condition may be diagnosed based on:  Your symptoms.  A physical exam of your ear, nose, and throat.  Tests, such as those that measure: ? The movement of your eardrum (tympanogram). ? Your hearing (audiometry).  How is this treated? Treatment  depends on the cause and severity of your condition. If your symptoms are mild, you may be able to relieve your symptoms by moving air into ("popping") your ears. If you have symptoms of fluid in your ears, treatment may include:  Decongestants.  Antihistamines.  Nasal sprays or ear drops that contain medicines that reduce swelling (steroids).  In some cases, you may need to have a procedure to drain the fluid in your eardrum (myringotomy). In this procedure, a small tube is placed in the eardrum to:  Drain the fluid.  Restore the air in the middle ear space.  Follow these instructions at home:  Take over-the-counter and prescription medicines only as told by your health care provider.  Use techniques to help pop your ears as recommended by your health care provider. These may include: ? Chewing gum. ? Yawning. ? Frequent, forceful swallowing. ? Closing your mouth, holding your nose closed, and gently blowing as if you are trying to blow air out of your nose.  Do not do any of the following until your health care provider approves: ? Travel to high altitudes. ? Fly in airplanes. ? Work in a pressurized cabin or room. ? Scuba dive.  Keep your ears dry. Dry your ears completely after showering or bathing.  Do not smoke.  Keep all follow-up visits as told by your health care provider. This is important. Contact a health care provider if:  Your symptoms do not go away after treatment.  Your symptoms come back after treatment.  You are   unable to pop your ears.  You have: ? A fever. ? Pain in your ear. ? Pain in your head or neck. ? Fluid draining from your ear.  Your hearing suddenly changes.  You become very dizzy.  You lose your balance. This information is not intended to replace advice given to you by your health care provider. Make sure you discuss any questions you have with your health care provider. Document Released: 10/29/2015 Document Revised: 03/09/2016  Document Reviewed: 10/21/2014 Elsevier Interactive Patient Education  2018 Elsevier Inc[employer] Prednisone tablets What is this medicine? PREDNISONE (PRED ni sone) is a corticosteroid. It is commonly used to treat inflammation of the skin, joints, lungs, and other organs. Common conditions treated include asthma, allergies, and arthritis. It is also used for other conditions, such as blood disorders and diseases of the adrenal glands. This medicine may be used for other purposes; ask your health care provider or pharmacist if you have questions. COMMON BRAND NAME(S): Deltasone, Predone, Sterapred, Sterapred DS What should I tell my health care provider before I take this medicine? They need to know if you have any of these conditions: -Cushing's syndrome -diabetes -glaucoma -heart disease -high blood pressure -infection (especially a virus infection such as chickenpox, cold sores, or herpes) -kidney disease -liver disease -mental illness -myasthenia gravis -osteoporosis -seizures -stomach or intestine problems -thyroid disease -an unusual or allergic reaction to lactose, prednisone, other medicines, foods, dyes, or preservatives -pregnant or trying to get pregnant -breast-feeding How should I use this medicine? Take this medicine by mouth with a glass of water. Follow the directions on the prescription label. Take this medicine with food. If you are taking this medicine once a day, take it in the morning. Do not take more medicine than you are told to take. Do not suddenly stop taking your medicine because you may develop a severe reaction. Your doctor will tell you how much medicine to take. If your doctor wants you to stop the medicine, the dose may be slowly lowered over time to avoid any side effects. Talk to your pediatrician regarding the use of this medicine in children. Special care may be needed. Overdosage: If you think you have taken too much of this medicine contact a poison  control center or emergency room at once. NOTE: This medicine is only for you. Do not share this medicine with others. What if I miss a dose? If you miss a dose, take it as soon as you can. If it is almost time for your next dose, talk to your doctor or health care professional. You may need to miss a dose or take an extra dose. Do not take double or extra doses without advice. What may interact with this medicine? Do not take this medicine with any of the following medications: -metyrapone -mifepristone This medicine may also interact with the following medications: -aminoglutethimide -amphotericin B -aspirin and aspirin-like medicines -barbiturates -certain medicines for diabetes, like glipizide or glyburide -cholestyramine -cholinesterase inhibitors -cyclosporine -digoxin -diuretics -ephedrine -female hormones, like estrogens and birth control pills -isoniazid -ketoconazole -NSAIDS, medicines for pain and inflammation, like ibuprofen or naproxen -phenytoin -rifampin -toxoids -vaccines -warfarin This list may not describe all possible interactions. Give your health care provider a list of all the medicines, herbs, non-prescription drugs, or dietary supplements you use. Also tell them if you smoke, drink alcohol, or use illegal drugs. Some items may interact with your medicine. What should I watch for while using this medicine? Visit your doctor or health care professional   for regular checks on your progress. If you are taking this medicine over a prolonged period, carry an identification card with your name and address, the type and dose of your medicine, and your doctor's name and address. This medicine may increase your risk of getting an infection. Tell your doctor or health care professional if you are around anyone with measles or chickenpox, or if you develop sores or blisters that do not heal properly. If you are going to have surgery, tell your doctor or health care  professional that you have taken this medicine within the last twelve months. Ask your doctor or health care professional about your diet. You may need to lower the amount of salt you eat. This medicine may affect blood sugar levels. If you have diabetes, check with your doctor or health care professional before you change your diet or the dose of your diabetic medicine. What side effects may I notice from receiving this medicine? Side effects that you should report to your doctor or health care professional as soon as possible: -allergic reactions like skin rash, itching or hives, swelling of the face, lips, or tongue -changes in emotions or moods -changes in vision -depressed mood -eye pain -fever or chills, cough, sore throat, pain or difficulty passing urine -increased thirst -swelling of ankles, feet Side effects that usually do not require medical attention (report to your doctor or health care professional if they continue or are bothersome): -confusion, excitement, restlessness -headache -nausea, vomiting -skin problems, acne, thin and shiny skin -trouble sleeping -weight gain This list may not describe all possible side effects. Call your doctor for medical advice about side effects. You may report side effects to FDA at 1-800-FDA-1088. Where should I keep my medicine? Keep out of the reach of children. Store at room temperature between 15 and 30 degrees C (59 and 86 degrees F). Protect from light. Keep container tightly closed. Throw away any unused medicine after the expiration date. NOTE: This sheet is a summary. It may not cover all possible information. If you have questions about this medicine, talk to your doctor, pharmacist, or health care provider.  2018 Elsevier/Gold Standard (2011-05-18 10:57:14)   Bronchospasm, Adult Bronchospasm is a tightening of the airways going into the lungs. During an episode, it may be harder to breathe. You may cough, and you may make a  whistling sound when you breathe (wheeze). This condition often affects people with asthma. What are the causes? This condition is caused by swelling and irritation in the airways. It can be triggered by:  An infection (common).  Seasonal allergies.  An allergic reaction.  Exercise.  Irritants. These include pollution, cigarette smoke, strong odors, aerosol sprays, and paint fumes.  Weather changes. Winds increase molds and pollens in the air. Cold air may cause swelling.  Stress and emotional upset.  What are the signs or symptoms? Symptoms of this condition include:  Wheezing. If the episode was triggered by an allergy, wheezing may start right away or hours later.  Nighttime coughing.  Frequent or severe coughing with a simple cold.  Chest tightness.  Shortness of breath.  Decreased ability to exercise.  How is this diagnosed? This condition is usually diagnosed with a review of your medical history and a physical exam. Tests, such as lung function tests, are sometimes done to look for other conditions. The need for a chest X-ray depends on where the wheezing occurs and whether it is the first time you have wheezed. How is this treated?  This condition may be treated with:  Inhaled medicines. These open up the airways and help you breathe. They can be taken with an inhaler or a nebulizer device.  Corticosteroid medicines. These may be given for severe bronchospasm, usually when it is associated with asthma.  Avoiding triggers, such as irritants, infection, or allergies.  Follow these instructions at home: Medicines  Take over-the-counter and prescription medicines only as told by your health care provider.  If you need to use an inhaler or nebulizer to take your medicine, ask your health care provider to explain how to use it correctly. If you were given a spacer, always use it with your inhaler. Lifestyle  Reduce the number of triggers in your home. To do  this: ? Change your heating and air conditioning filter at least once a month. ? Limit your use of fireplaces and wood stoves. ? Do not smoke. Do not allow smoking in your home. ? Avoid using perfumes and fragrances. ? Get rid of pests, such as roaches and mice, and their droppings. ? Remove any mold from your home. ? Keep your house clean and dust free. Use unscented cleaning products. ? Replace carpet with wood, tile, or vinyl flooring. Carpet can trap dander and dust. ? Use allergy-proof pillows, mattress covers, and box spring covers. ? Wash bed sheets and blankets every week in hot water. Dry them in a dryer. ? Use blankets that are made of polyester or cotton. ? Wash your hands often. ? Do not allow pets in your bedroom.  Avoid breathing in cold air when you exercise. General instructions  Have a plan for seeking medical care. Know when to call your health care provider and local emergency services, and where to get emergency care.  Stay up to date on your immunizations.  When you have an episode of bronchospasm, stay calm. Try to relax and breathe more slowly.  If you have asthma, make sure you have an asthma action plan.  Keep all follow-up visits as told by your health care provider. This is important. Contact a health care provider if:  You have muscle aches.  You have chest pain.  The mucus that you cough up (sputum) changes from clear or white to yellow, green, gray, or bloody.  You have a fever.  Your sputum gets thicker. Get help right away if:  Your wheezing and coughing get worse, even after you take your prescribed medicines.  It gets even harder to breathe.  You develop severe chest pain. Summary  Bronchospasm is a tightening of the airways going into the lungs.  During an episode of bronchospasm, you may have a harder time breathing. You may cough and make a whistling sound when you breathe (wheeze).  Avoid exposure to triggers such as smoke,  dust, mold, animal dander, and fragrances.  When you have an episode of bronchospasm, stay calm. Try to relax and breathe more slowly. This information is not intended to replace advice given to you by your health care provider. Make sure you discuss any questions you have with your health care provider. Document Released: 10/05/2003 Document Revised: 09/28/2016 Document Reviewed: 09/28/2016 Elsevier Interactive Patient Education  2017 [employer].

## 2018-03-12 ENCOUNTER — Encounter: Payer: Self-pay | Admitting: Physician Assistant

## 2018-03-12 ENCOUNTER — Ambulatory Visit: Payer: BLUE CROSS/BLUE SHIELD | Admitting: Physician Assistant

## 2018-03-12 ENCOUNTER — Other Ambulatory Visit: Payer: Self-pay

## 2018-03-12 VITALS — BP 128/82 | HR 81 | Temp 98.0°F | Resp 16 | Ht 69.49 in | Wt 200.0 lb

## 2018-03-12 DIAGNOSIS — J302 Other seasonal allergic rhinitis: Secondary | ICD-10-CM | POA: Diagnosis not present

## 2018-03-12 DIAGNOSIS — F320 Major depressive disorder, single episode, mild: Secondary | ICD-10-CM

## 2018-03-12 DIAGNOSIS — D72829 Elevated white blood cell count, unspecified: Secondary | ICD-10-CM | POA: Diagnosis not present

## 2018-03-12 DIAGNOSIS — R55 Syncope and collapse: Secondary | ICD-10-CM | POA: Diagnosis not present

## 2018-03-12 DIAGNOSIS — F32A Depression, unspecified: Secondary | ICD-10-CM

## 2018-03-12 DIAGNOSIS — E876 Hypokalemia: Secondary | ICD-10-CM | POA: Diagnosis not present

## 2018-03-12 MED ORDER — SERTRALINE HCL 25 MG PO TABS: 25.0000 mg | ORAL_TABLET | Freq: Every day | ORAL | 1 refills | Status: DC

## 2018-03-12 MED ORDER — FLUTICASONE PROPIONATE 50 MCG/ACT NA SUSP: 2.0000 | Freq: Every day | NASAL | 4 refills | Status: DC

## 2018-03-12 NOTE — Patient Instructions (Addendum)
Please download the APP called mychart - then use the text to activate this APP - this will allow you to look at your labs and contact me as well as make appointments to see me in the future.  I will contact you with your lab results as soon as they are available.   If you have not heard from me in 2 weeks, please contact me.  The fastest way to get your results is to register for My Chart (see the instructions on this printout).   IF you received an x-ray today, you will receive an invoice from [facility]. Please contact [facility] at [phone] with questions or concerns regarding your invoice.   IF you received labwork today, you will receive an invoice from [facility]. Please contact [facility] at [phone] with questions or concerns regarding your invoice.   Our billing staff will not be able to assist you with questions regarding bills from these companies.  You will be contacted with the lab results as soon as they are available. The fastest way to get your results is to activate your My Chart account. Instructions are located on the last page of this paperwork. If you have not heard from us regarding the results in 2 weeks, please contact this office.     

## 2018-03-12 NOTE — Progress Notes (Signed)
Jean [name]  MRN: [id number] DOB: October 15, 1981  PCP: Patient, No Pcp Per  Chief Complaint  Patient presents with  . Establish Care    Subjective:  Pt presents to clinic to establish care.  She works at [employer] and they have a health center which she typically uses but she would like to have someone that knows her better.  She is healthy.  She has a 40 month old daughter at home - she is no longer breastfeeding.  She eats healthy but does admit that she does not drink enough water.  She has noticed that her sinuses and allergies are worse since her pregnancy.  She is currently taking zyrtec and flonase for her allergies.  She had an episode of near syncope and at the ED she was told she has a heart murmur that she knows nothing about.  She is interested in knowing if she still has it as no one has ever mentioned it to her in the past.  Reviewed hospital records - at the ED visit on 4/16, normal EKG, hypokalemic and elevated WBC.  She has not had these repeated.  History is obtained by patient.  Review of Systems  Constitutional: Negative for chills and fever.  HENT: Negative.   Respiratory: Negative.   Cardiovascular: Negative.   Genitourinary: Positive for menstrual problem (more cramps since pregnancy).  Allergic/Immunologic: Positive for environmental allergies.    Patient Active Problem List   Diagnosis Date Noted  . Mild depression ([place]) 03/01/2018    Current Outpatient Medications on File Prior to Visit  Medication Sig Dispense Refill  . cetirizine (ZYRTEC) 10 MG tablet Take 10 mg by mouth daily.    Marland Kitchen NUVARING 0.12-0.015 MG/24HR vaginal ring Place 1 each vaginally every 28 (twenty-eight) days. Insert for 3 weeks (21 days) and remove for 1 week break (7 days).  1   No current facility-administered medications on file prior to visit.     No Known Allergies  Past Medical History:  Diagnosis Date  . Depression    Social History   Social History Narrative      Lives  with husband and daughter      Works at [employer] - admission for PA program      No guns   Seatbelt - 100%   Social History   Tobacco Use  . Smoking status: Never Smoker  . Smokeless tobacco: Never Used  Substance Use Topics  . Alcohol use: No  . Drug use: No   family history includes Bipolar disorder in her brother and maternal grandmother; Diabetes in her maternal grandfather and sister; Elevated Lipids in her paternal grandmother; Gestational diabetes in her sister; Heart disease in her paternal grandmother; Lung cancer in her paternal grandfather.     Objective:  BP 128/82   Pulse 81   Temp 98 F (36.7 C) (Oral)   Resp 16   Ht 5' 9.49" (1.765 m)   Wt 200 lb (90.7 kg)   LMP 02/13/2018   SpO2 98%   BMI 29.12 kg/m  Body mass index is 29.12 kg/m.  Physical Exam  Constitutional: She is oriented to person, place, and time.  HENT:  Head: Normocephalic and atraumatic.  Right Ear: Hearing and external ear normal.  Left Ear: Hearing and external ear normal.  Eyes: Conjunctivae are normal.  Neck: Normal range of motion.  Cardiovascular: Normal rate, regular rhythm and normal heart sounds.  No murmur (none heard on exam today) heard. Pulmonary/Chest: Effort normal and breath sounds  normal. She has no wheezes.  Neurological: She is alert and oriented to person, place, and time.  Skin: Skin is warm and dry.  Psychiatric: Judgment normal.  Vitals reviewed.   Assessment and Plan :  Mild depression ([place]) - Plan: sertraline (ZOLOFT) 25 MG tablet - continue medication - she has been well controlled - this allows her to be more even and less irritable  Seasonal allergies - Plan: fluticasone (FLONASE) 50 MCG/ACT nasal spray - continue medication for her allergies  Leukocytosis, unspecified type - Plan: CBC with Differential/Platelet- check labs  Hypokalemia - Plan: CMP14+EGFR - check labs  Near syncope - Plan: CBC with Differential/Platelet, CMP14+EGFR, TSH - d/w pt to eat more  regularly like 3 meals and 2 small snacks, increase fluids to help prevent this - we will check labs to make sure they have returned to normal  Windell Hummingbird PA-C  [facility] at [place] 03/12/2018 10:13 AM

## 2018-03-13 LAB — CBC WITH DIFFERENTIAL/PLATELET
BASOS: 1 %
Basophils Absolute: 0.1 10*3/uL (ref 0.0–0.2)
EOS (ABSOLUTE): 0.4 10*3/uL (ref 0.0–0.4)
EOS: 4 %
HEMATOCRIT: 40.4 % (ref 34.0–46.6)
HEMOGLOBIN: 13.7 g/dL (ref 11.1–15.9)
IMMATURE GRANS (ABS): 0 10*3/uL (ref 0.0–0.1)
IMMATURE GRANULOCYTES: 0 %
LYMPHS: 28 %
Lymphocytes Absolute: 3.2 10*3/uL — ABNORMAL HIGH (ref 0.7–3.1)
MCH: 29.9 pg (ref 26.6–33.0)
MCHC: 33.9 g/dL (ref 31.5–35.7)
MCV: 88 fL (ref 79–97)
Monocytes Absolute: 1 10*3/uL — ABNORMAL HIGH (ref 0.1–0.9)
Monocytes: 9 %
NEUTROS ABS: 6.8 10*3/uL (ref 1.4–7.0)
NEUTROS PCT: 58 %
PLATELETS: 379 10*3/uL (ref 150–450)
RBC: 4.58 x10E6/uL (ref 3.77–5.28)
RDW: 13.9 % (ref 12.3–15.4)
WBC: 11.5 10*3/uL — ABNORMAL HIGH (ref 3.4–10.8)

## 2018-03-13 LAB — CMP14+EGFR
A/G RATIO: 2 (ref 1.2–2.2)
ALBUMIN: 4.8 g/dL (ref 3.5–5.5)
ALT: 18 IU/L (ref 0–32)
AST: 13 IU/L (ref 0–40)
Alkaline Phosphatase: 76 IU/L (ref 39–117)
BILIRUBIN TOTAL: 0.3 mg/dL (ref 0.0–1.2)
BUN / CREAT RATIO: 14 (ref 9–23)
BUN: 10 mg/dL (ref 6–20)
CALCIUM: 9.7 mg/dL (ref 8.7–10.2)
CHLORIDE: 99 mmol/L (ref 96–106)
CO2: 20 mmol/L (ref 20–29)
Creatinine, Ser: 0.69 mg/dL (ref 0.57–1.00)
GFR, EST AFRICAN AMERICAN: 130 mL/min/{1.73_m2} (ref >59)
GFR, EST NON AFRICAN AMERICAN: 113 mL/min/{1.73_m2} (ref >59)
GLOBULIN, TOTAL: 2.4 g/dL (ref 1.5–4.5)
Glucose: 84 mg/dL (ref 65–99)
POTASSIUM: 4.2 mmol/L (ref 3.5–5.2)
SODIUM: 142 mmol/L (ref 134–144)
TOTAL PROTEIN: 7.2 g/dL (ref 6.0–8.5)

## 2018-03-13 LAB — TSH: TSH: 4.14 u[IU]/mL (ref 0.450–4.500)

## 2018-06-18 DIAGNOSIS — Z130 Encounter for screening for diseases of the blood and blood-forming organs and certain disorders involving the immune mechanism: Secondary | ICD-10-CM | POA: Diagnosis not present

## 2018-06-18 DIAGNOSIS — Z1389 Encounter for screening for other disorder: Secondary | ICD-10-CM | POA: Diagnosis not present

## 2018-06-18 DIAGNOSIS — Z01419 Encounter for gynecological examination (general) (routine) without abnormal findings: Secondary | ICD-10-CM | POA: Diagnosis not present

## 2018-06-18 DIAGNOSIS — Z6830 Body mass index (BMI) 30.0-30.9, adult: Secondary | ICD-10-CM | POA: Diagnosis not present

## 2018-06-18 DIAGNOSIS — Z23 Encounter for immunization: Secondary | ICD-10-CM | POA: Diagnosis not present

## 2018-09-25 ENCOUNTER — Other Ambulatory Visit: Payer: Self-pay | Admitting: Family Medicine

## 2018-09-25 DIAGNOSIS — F320 Major depressive disorder, single episode, mild: Secondary | ICD-10-CM

## 2018-09-25 DIAGNOSIS — F32A Depression, unspecified: Secondary | ICD-10-CM

## 2018-09-25 NOTE — Telephone Encounter (Signed)
Copied from CRM [id number]. Topic: Quick Communication - Rx Refill/Question >> Sep 25, 2018  2:03 PM Arlyss Gandyichardson, Adilee Lemme N, NT wrote: Medication: sertraline (ZOLOFT) 25 MG tablet   Has the patient contacted their pharmacy? Yes.   (Agent: If no, request that the patient contact the pharmacy for the refill.) (Agent: If yes, when and what did the pharmacy advise?)\  Pt will be out of medication in your 5 days and the first available office visit to is not until January. Pt scheduled for 10/30/18. Requesting enough med to be sent in to last until that appt. Please advise.   Preferred Pharmacy (with phone number or street name): [id number] [place]- , [place] - [address] [facility] [address] [phone] (Phone) [phone] (Fax)    Agent: Please be advised that RX refills may take up to 3 business days. We ask that you follow-up with your pharmacy.

## 2018-09-27 MED ORDER — SERTRALINE HCL 25 MG PO TABS: 25.0000 mg | ORAL_TABLET | Freq: Every day | ORAL | 1 refills | Status: DC

## 2018-09-27 NOTE — Telephone Encounter (Signed)
Requested Prescriptions  Pending Prescriptions Disp Refills  . sertraline (ZOLOFT) 25 MG tablet 90 tablet 1    Sig: Take 1 tablet (25 mg total) by mouth daily.     Psychiatry:  Antidepressants - SSRI Failed - 09/25/2018  3:26 PM      Failed - Valid encounter within last 6 months    Recent Outpatient Visits          6 months ago Mild depression [facility])   [facility], Dema SeverinSarah L, PA-C      Future Appointments            In 1 month Myles LippsSantiago, Irma M, MD [facility], [facility]           Passed - Completed PHQ-2 or PHQ-9 in the last 360 days.

## 2018-10-30 ENCOUNTER — Encounter: Payer: Self-pay | Admitting: Family Medicine

## 2018-10-30 ENCOUNTER — Ambulatory Visit: Payer: BLUE CROSS/BLUE SHIELD | Admitting: Family Medicine

## 2018-10-30 ENCOUNTER — Other Ambulatory Visit: Payer: Self-pay

## 2018-10-30 VITALS — BP 138/82 | HR 94 | Temp 98.5°F | Ht 69.49 in | Wt 222.2 lb

## 2018-10-30 DIAGNOSIS — R400 Somnolence: Secondary | ICD-10-CM

## 2018-10-30 DIAGNOSIS — R0683 Snoring: Secondary | ICD-10-CM

## 2018-10-30 DIAGNOSIS — F32A Depression, unspecified: Secondary | ICD-10-CM

## 2018-10-30 DIAGNOSIS — F320 Major depressive disorder, single episode, mild: Secondary | ICD-10-CM | POA: Diagnosis not present

## 2018-10-30 MED ORDER — SERTRALINE HCL 25 MG PO TABS: 25.0000 mg | ORAL_TABLET | Freq: Every day | ORAL | 1 refills | Status: DC

## 2018-10-30 NOTE — Progress Notes (Signed)
1/15/20208:47 AM  Jean [name] 07/09/1981, 38 y.o. female [id number]  Chief Complaint  Patient presents with  . Transitions Of Care    Jean [name] past pt  . Depression    refill of zoloft   . Anxiety    HPI:   Patient is a 38 y.o. female with past medical history significant for depression and anxiety who presents today to establish care  Last OV with Jean [name] in May 2019 Since last OV she has been doing well zoloft "helps a lot" In grad school had a major depressive episode, did counseling then Notices that when she is off zoloft she is very irritable and gets very nervous with normal tasks, overwhelmed Has an 4918 month old, very supportive husband Increased stressors at work Has sleeping lots of recent - this is her escape Snoring has increased Wakes up with headaches - thinks this is related to teeth grinding, has mouth guard she uses prn Does doze easily in the evenings when watching TV Does not wake up refreshed Not exercising    Wt Readings from Last 3 Encounters:  10/30/18 222 lb 3.2 oz (100.8 kg)  03/12/18 200 lb (90.7 kg)  03/05/18 199 lb 6.4 oz (90.4 kg)     Fall Risk  03/12/2018  Falls in the past year? No     [facility] 10/30/2018 03/12/2018  Decreased Interest 1 0  Down, Depressed, Hopeless 1 0  PHQ - 2 Score 2 0  Altered sleeping 2 -  Tired, decreased energy 1 -  Change in appetite 1 -  Feeling bad or failure about yourself  2 -  Trouble concentrating 0 -  Moving slowly or fidgety/restless 0 -  Suicidal thoughts 0 -  PHQ-9 Score 8 -  Difficult doing work/chores Somewhat difficult -   GAD 7 : Generalized Anxiety Score 10/30/2018  Nervous, Anxious, on Edge 1  Control/stop worrying 1  Worry too much - different things 0  Trouble relaxing 0  Restless 0  Easily annoyed or irritable 2  Afraid - awful might happen 0  Total GAD 7 Score 4  Anxiety Difficulty Somewhat difficult    No Known Allergies  Prior to Admission  medications   Medication Sig Start Date End Date Taking? Authorizing Provider  cetirizine (ZYRTEC) 10 MG tablet Take 10 mg by mouth daily.   Yes [provider]  NUVARING 0.12-0.015 MG/24HR vaginal ring Place 1 each vaginally every 28 (twenty-eight) days. Insert for 3 weeks (21 days) and remove for 1 week break (7 days). 12/12/17  Yes [provider]  sertraline (ZOLOFT) 25 MG tablet Take 1 tablet (25 mg total) by mouth daily. 09/27/18  Yes Jean LippsSantiago, Diontre Harps M, MD    Past Medical History:  Diagnosis Date  . Depression     No past surgical history on file.  Social History   Tobacco Use  . Smoking status: Never Smoker  . Smokeless tobacco: Never Used  Substance Use Topics  . Alcohol use: No    Family History  Problem Relation Age of Onset  . Diabetes Sister   . Bipolar disorder Brother   . Bipolar disorder Maternal Grandmother   . Diabetes Maternal Grandfather   . Heart disease Paternal Grandmother   . Elevated Lipids Paternal Grandmother   . Lung cancer Paternal Grandfather   . Gestational diabetes Sister     ROS Per hpi  OBJECTIVE:  Blood pressure 138/82, pulse 94, temperature 98.5 F (36.9 C), temperature source Oral, height  5' 9.49" (1.765 m), weight 222 lb 3.2 oz (100.8 kg), SpO2 96 %, unknown if currently breastfeeding. Body mass index is 32.35 kg/m.   Physical Exam Vitals signs and nursing note reviewed.  Constitutional:      Appearance: She is well-developed.  HENT:     Head: Normocephalic and atraumatic.  Eyes:     General: No scleral icterus.    Conjunctiva/sclera: Conjunctivae normal.     Pupils: Pupils are equal, round, and reactive to light.  Neck:     Musculoskeletal: Neck supple.  Pulmonary:     Effort: Pulmonary effort is normal.  Skin:    General: Skin is warm and dry.  Neurological:     Mental Status: She is alert and oriented to person, place, and time.    ASSESSMENT and PLAN  1. Snoring 2. Daytime sleepiness Main  remaining symptom, sleep study to r/o OSA Discussed importance of exercise - Ambulatory referral to Sleep Studies  3. Mild depression (HCC) Doing well. Continue current medication - sertraline (ZOLOFT) 25 MG tablet; Take 1 tablet (25 mg total) by mouth daily.  Return in about 4 weeks (around 11/27/2018) for after sleep study.    Jean Lipps, MD Primary Care at [facility] [address] [place], [place] [address] Ph.  [phone] Fax [phone]

## 2018-10-30 NOTE — Patient Instructions (Signed)
° ° ° °  If you have lab work done today you will be contacted with your lab results within the next 2 weeks.  If you have not heard from us then please contact us. The fastest way to get your results is to register for My Chart. ° ° °IF you received an x-ray today, you will receive an invoice from [facility]. Please contact [facility] at [phone] with questions or concerns regarding your invoice.  ° °IF you received labwork today, you will receive an invoice from [facility]. Please contact [facility] at [phone] with questions or concerns regarding your invoice.  ° °Our billing staff will not be able to assist you with questions regarding bills from these companies. ° °You will be contacted with the lab results as soon as they are available. The fastest way to get your results is to activate your My Chart account. Instructions are located on the last page of this paperwork. If you have not heard from us regarding the results in 2 weeks, please contact this office. °  ° ° ° °

## 2018-11-08 ENCOUNTER — Encounter: Payer: Self-pay | Admitting: Family Medicine

## 2018-11-22 ENCOUNTER — Encounter: Payer: Self-pay | Admitting: Nurse Practitioner

## 2018-11-22 ENCOUNTER — Ambulatory Visit: Payer: BLUE CROSS/BLUE SHIELD | Admitting: Nurse Practitioner

## 2018-11-22 ENCOUNTER — Telehealth: Payer: Self-pay

## 2018-11-22 VITALS — BP 142/88 | HR 89 | Temp 98.4°F | Ht 69.0 in | Wt 223.0 lb

## 2018-11-22 DIAGNOSIS — J069 Acute upper respiratory infection, unspecified: Secondary | ICD-10-CM

## 2018-11-22 NOTE — Telephone Encounter (Signed)
Contacted patient to let her know the Norel AD is non formulary. To prevent paying out of pocket we recommended her to take OTC DayQuil/NyQuil for her symptoms per Marnette Burgess NP. Left patient a message via voicemail and instructed her to call us back with any further questions.

## 2018-11-22 NOTE — Patient Instructions (Addendum)
Nice meeting you today Jean [name]! Drink plenty of fluids, continue good hand hygiene and rest Take med as directed; noted Norel AD not covered by plan after visit when prescirbing and patient to be notified to take over the counter dayquil/nyquil as directed by RN. Message has been left to call back. If symptoms persist or worsen please return to the clinic

## 2018-11-22 NOTE — Progress Notes (Signed)
   Subjective:    Patient ID: Jean [name], female    DOB: 09/26/1981, 38 y.o.   MRN: [id number]  HPI Jean [name] reports thick yellow clumpy mucus and cough and triggered by laughing. Reports PND that is causing scratchy throat; which occurred first. Bilateral ear pressure with no good. Denies fever, n/v/d. Denies anyone in home having similar symptoms. No self treatment.   Review of Systems  HENT: Positive for postnasal drip and sore throat. Negative for ear pain, sinus pressure and sinus pain.        Bilateral ear pressure  Respiratory: Positive for cough. Negative for shortness of breath and wheezing.        Objective:   Physical Exam Vitals signs reviewed.  Constitutional:      Appearance: Normal appearance. She is well-developed.  HENT:     Head: Normocephalic and atraumatic.     Right Ear: Ear canal normal.     Left Ear: Ear canal normal.     Ears:     Comments: Left TM bulging with clear serous fluid behind intact nonerythematous membrane.  Right TM retracted with clear serous fluid behind not erythematous TM.    Nose: Nose normal.     Mouth/Throat:     Mouth: Mucous membranes are moist.     Comments: Mildly injected pharynx Neck:     Musculoskeletal: Normal range of motion and neck supple.     Comments: Bilateral upper anterior lymphadenopathy with no tenderness Cardiovascular:     Rate and Rhythm: Normal rate and regular rhythm.     Heart sounds: Normal heart sounds.  Pulmonary:     Effort: Pulmonary effort is normal. No respiratory distress.     Breath sounds: Normal breath sounds. No wheezing or rhonchi.  Abdominal:     General: Abdomen is flat. Bowel sounds are normal.     Palpations: Abdomen is soft.  Musculoskeletal: Normal range of motion.  Lymphadenopathy:     Cervical: Cervical adenopathy present.  Skin:    General: Skin is warm and dry.  Neurological:     Mental Status: She is alert and oriented to person, place, and time.           Assessment  & Plan:

## 2018-11-29 ENCOUNTER — Other Ambulatory Visit: Payer: Self-pay | Admitting: Family Medicine

## 2018-11-29 DIAGNOSIS — F32A Depression, unspecified: Secondary | ICD-10-CM

## 2018-11-29 DIAGNOSIS — F320 Major depressive disorder, single episode, mild: Secondary | ICD-10-CM

## 2018-11-30 ENCOUNTER — Telehealth: Payer: Self-pay | Admitting: Family Medicine

## 2018-11-30 NOTE — Telephone Encounter (Signed)
Pt would like a refill on her sertraline (ZOLOFT) 25 MG tablet, her pharmacy asked her to call in.  Pharmacy:  [id number] - [place], [place] - [address] AT [facility] [address] [phone]

## 2018-12-04 NOTE — Telephone Encounter (Signed)
Called and informed pt that there are refills at her pharmacy.

## 2018-12-06 ENCOUNTER — Ambulatory Visit: Payer: BLUE CROSS/BLUE SHIELD | Admitting: Family Medicine

## 2018-12-11 ENCOUNTER — Ambulatory Visit: Payer: BLUE CROSS/BLUE SHIELD | Admitting: Neurology

## 2018-12-11 ENCOUNTER — Encounter: Payer: Self-pay | Admitting: Neurology

## 2018-12-11 VITALS — BP 157/97 | HR 86 | Ht 70.0 in | Wt 225.0 lb

## 2018-12-11 DIAGNOSIS — Z9189 Other specified personal risk factors, not elsewhere classified: Secondary | ICD-10-CM

## 2018-12-11 DIAGNOSIS — R519 Headache, unspecified: Secondary | ICD-10-CM

## 2018-12-11 DIAGNOSIS — R51 Headache: Secondary | ICD-10-CM | POA: Diagnosis not present

## 2018-12-11 DIAGNOSIS — R0683 Snoring: Secondary | ICD-10-CM

## 2018-12-11 DIAGNOSIS — G479 Sleep disorder, unspecified: Secondary | ICD-10-CM | POA: Diagnosis not present

## 2018-12-11 DIAGNOSIS — G4763 Sleep related bruxism: Secondary | ICD-10-CM

## 2018-12-11 DIAGNOSIS — G4719 Other hypersomnia: Secondary | ICD-10-CM | POA: Diagnosis not present

## 2018-12-11 NOTE — Progress Notes (Signed)
Subjective:    Patient ID: Jean [name] is a 38 y.o. female.  HPI     Huston Foley, MD, PhD [facility] [address], [address] [address] [place], [place] [address]  Dear Dr. Leretha Pol,   I saw your patient, Jean [name], upon your kind request in my sleep clinic today for initial consultation of her sleep disorder, in particular, concern for underlying obstructive sleep apnea. The patient is unaccompanied today. As you know, Jean [name] is a 38 year old right-handed woman with an underlying medical history of anxiety, depression, and obesity, who reports snoring and excessive daytime somnolence. I reviewed your office note from 10/30/2018. Her Epworth sleepiness score is 9 out of 24 today, fatigue score is 20 out of 63. She is married and lives with her husband and her young child, 19 mo daughter. She works at [employer]. She is a nonsmoker and drinks alcohol maybe one serving a day on average, caffeine about 2 servings per day. She requires a lot of sleep, she believes, even as much as 9 hours, has been like this for a long time, she has not experienced severe or uncontrollable sleepiness, such as falling asleep inadvertently, at work, or at the wheel. Her snoring improved when she was able to lose weight. She was able to lose her AB weight but in the past year gained about nearly 30 pounds she estimates. Bedtime is generally between 9 and 9:30 PM, right stent between 5 and 6. They do have 5 cats in the household and they are typically in her bedroom, some of them on her bed. She has at times slept in a different bedroom away from the pets and also to not disturb her husband with her snoring. She does not have night to night nocturia but has occasionally woken up with a headache. She does not have any known family history of OSA. She does have a history of nighttime bruxism and if it flares up she will use her custom-made mouthguard for a few nights. She has had  some recent stressors, [place] are [place]. 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 She has been on sertraline off and on for years, likely over 10 years.  Her Past Medical History Is Significant For: Past Medical History:  Diagnosis Date  . Depression     Her Past Surgical History Is Significant For: No past surgical history on file.  Her Family History Is Significant For: Family History  Problem Relation Age of Onset  . Diabetes Sister   . Bipolar disorder Brother   . Bipolar disorder Maternal Grandmother   . Diabetes Maternal Grandfather   . Heart disease Paternal Grandmother   . Elevated Lipids Paternal Grandmother   . Lung cancer Paternal Grandfather   . Gestational diabetes Sister     Her Social History Is Significant For: Social History   Socioeconomic History  . Marital status: Married    Spouse name: Gerilyn Pilgrim  . Number of children: 1  . Years of education: Child psychotherapist degree  . Highest education level: Not on file  Occupational History  . Occupation: admissions  Social Needs  . Financial resource strain: Not on file  . Food insecurity:    Worry: Not on file    Inability: Not on file  . Transportation needs:    Medical: Not on file    Non-medical: Not on file  Tobacco Use  . Smoking status: Never Smoker  . Smokeless tobacco: Never Used  Substance and Sexual  Activity  . Alcohol use: No  . Drug use: No  . Sexual activity: Yes    Partners: Male    Birth control/protection: Other-see comments    Comment: nuvaring  Lifestyle  . Physical activity:    Days per week: Not on file    Minutes per session: Not on file  . Stress: Not on file  Relationships  . Social connections:    Talks on phone: Not on file    Gets together: Not on file    Attends religious service: Not on file    Active member of club or organization: Not on file    Attends meetings of clubs or organizations: Not on file    Relationship status: Not on file  Other Topics Concern  . 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 cetirizine (ZYRTEC) 10 MG tablet Take 10 mg by mouth daily.  Marland Kitchen NUVARING 0.12-0.015 MG/24HR vaginal ring Place 1 each vaginally every 28 (twenty-eight) days. Insert for 3 weeks (21 days) and remove for 1 week break (7 days).  . sertraline (ZOLOFT) 25 MG tablet Take 1 tablet (25 mg total) by mouth daily.   No facility-administered encounter medications on file as of 12/11/2018.   : Review of Systems:  Out of a complete 14 point review of systems, all are reviewed and negative with the exception of these symptoms as listed below:  Review of Systems  Neurological:       Pt presents today to discuss her sleep. Pt has never had a sleep study but does endorse snoring.  Epworth Sleepiness Scale 0= would never doze 1= slight chance of dozing 2= moderate chance of dozing 3= high chance of dozing  Sitting and reading: 2 Watching TV: 2 Sitting inactive in a public place (ex. Theater or meeting): 0 As a passenger in a car for an hour without a break: 1 Lying down to rest in the afternoon: 3 Sitting and talking to someone: 0 Sitting quietly after lunch (no alcohol): 1 In a car, while stopped in traffic: 0 Total: 9     Objective:  Neurological Exam  Physical Exam Physical Examination:   Vitals:   12/11/18 0853  BP: (!) 157/97  Pulse: 86   General Examination: The patient is a very pleasant 38 y.o. female in no acute distress. She appears well-developed and well-nourished and well groomed.   HEENT: Normocephalic, atraumatic, pupils are equal, round and reactive to light and accommodation. Funduscopic exam is normal with sharp disc margins noted. Extraocular tracking is good without  limitation to gaze excursion or nystagmus noted. Normal smooth pursuit is noted. Hearing is grossly intact. Face is symmetric with normal facial animation and normal facial sensation. Speech is clear with no dysarthria noted. There is no hypophonia. There is no lip, neck/head, jaw or voice tremor. Neck is supple with full range of passive and active motion. There are no carotid bruits on auscultation. Oropharynx exam reveals: mild mouth dryness, good dental hygiene and mild airway crowding, due to smaller airway entry, tonsils 1+ bilaterally, whole-body is class II, neck circumference is 15-1/2 inches. Tongue protrudes centrally and palate elevates  symmetrically. She has a mild overbite. Nasal passage examination shows narrow nasal passages and perhaps slight deviation of the septum to the left.  Chest: Clear to auscultation without wheezing, rhonchi or crackles noted.  Heart: S1+S2+0, regular and normal without murmurs, rubs or gallops noted.   Abdomen: Soft, non-tender and non-distended with normal bowel sounds appreciated on auscultation.  Extremities: There is no pitting edema in the distal lower extremities bilaterally.   Skin: Warm and dry without trophic changes noted.  Musculoskeletal: exam reveals no obvious joint deformities, tenderness or joint swelling or erythema.   Neurologically:  Mental status: The patient is awake, alert and oriented in all 4 spheres. Her immediate and remote memory, attention, language skills and fund of knowledge are appropriate. There is no evidence of aphasia, agnosia, apraxia or anomia. Speech is clear with normal prosody and enunciation. Thought process is linear. Mood is normal and affect is normal.  Cranial nerves II - XII are as described above under HEENT exam. In addition: shoulder shrug is normal with equal shoulder height noted. Motor exam: Normal bulk, strength and tone is noted. There is no drift, tremor or rebound. Romberg is negative. Fine motor  skills and coordination: grossly intact.  Cerebellar testing: No dysmetria or intention tremor.  Sensory exam: intact to light touch in the upper and lower extremities.  Gait, station and balance: She stands easily. No veering to one side is noted. No leaning to one side is noted. Posture is age-appropriate and stance is narrow based. Gait shows normal stride length and normal pace. No problems turning are noted. Tandem walk is unremarkable.   Assessment and Plan:  In summary, Jean [name] is a very pleasant 38 y.o.-year old female with an underlying medical history of anxiety, depression, and obesity, whose history and physical exam concerning for obstructive sleep apnea (OSA). I had a long chat with the patient about my findings and the diagnosis of OSA, its prognosis and treatment options. We talked about medical treatments, surgical interventions and non-pharmacological approaches. I explained in particular the risks and ramifications of untreated moderate to severe OSA, especially with respect to developing cardiovascular disease down the Road, including congestive heart failure, difficult to treat hypertension, cardiac arrhythmias, or stroke. Even type 2 diabetes has, in part, been linked to untreated OSA. Symptoms of untreated OSA include daytime sleepiness, memory problems, mood irritability and mood disorder such as depression and anxiety, lack of energy, as well as recurrent headaches, especially morning headaches. We talked about trying to maintain a healthy lifestyle in general, as well as the importance of weight control. I encouraged the patient to eat healthy, exercise daily and keep well hydrated, to keep a scheduled bedtime and wake time routine, to not skip any meals and eat healthy snacks in between meals. I advised the patient not to drive when feeling sleepy. I recommended the following at this time: sleep study with potential positive airway pressure titration. (We will score  hypopneas at 3%).   I explained the sleep test procedure to the patient and also outlined possible surgical and non-surgical treatment options of OSA, including the use of a custom-made dental device (which would require a referral to a specialist dentist or oral surgeon), upper airway surgical options, such as pillar implants, radiofrequency surgery, tongue base surgery, and UPPP (which would involve a referral to an ENT surgeon). Rarely, jaw surgery such as mandibular advancement may be considered.  I also explained the CPAP treatment option to the patient, who indicated that she  would be willing to try CPAP if the need arises. I explained the importance of being compliant with PAP treatment, not only for insurance purposes but primarily to improve Her symptoms, and for the patient's long term health benefit, including to reduce Her cardiovascular risks. I answered all her questions today and the patient was in agreement. I plan to see her back after the sleep study is completed and encouraged her to call with any interim questions, concerns, problems or updates.   Thank you very much for allowing me to participate in the care of this nice patient. If I can be of any further assistance to you please do not hesitate to call me at [phone].  Sincerely,   Huston Foley, MD, PhD

## 2018-12-11 NOTE — Patient Instructions (Signed)

## 2019-01-20 DIAGNOSIS — F331 Major depressive disorder, recurrent, moderate: Secondary | ICD-10-CM | POA: Diagnosis not present

## 2019-01-27 DIAGNOSIS — F331 Major depressive disorder, recurrent, moderate: Secondary | ICD-10-CM | POA: Diagnosis not present

## 2019-02-03 ENCOUNTER — Encounter: Payer: Self-pay | Admitting: Family Medicine

## 2019-02-03 DIAGNOSIS — F320 Major depressive disorder, single episode, mild: Secondary | ICD-10-CM

## 2019-02-03 DIAGNOSIS — F32A Depression, unspecified: Secondary | ICD-10-CM

## 2019-02-03 DIAGNOSIS — F331 Major depressive disorder, recurrent, moderate: Secondary | ICD-10-CM | POA: Diagnosis not present

## 2019-02-05 MED ORDER — SERTRALINE HCL 50 MG PO TABS: 50.0000 mg | ORAL_TABLET | Freq: Every day | ORAL | 1 refills | Status: DC

## 2019-02-10 ENCOUNTER — Other Ambulatory Visit: Payer: Self-pay

## 2019-02-10 ENCOUNTER — Ambulatory Visit (INDEPENDENT_AMBULATORY_CARE_PROVIDER_SITE_OTHER): Payer: BLUE CROSS/BLUE SHIELD | Admitting: Neurology

## 2019-02-10 DIAGNOSIS — R51 Headache: Secondary | ICD-10-CM

## 2019-02-10 DIAGNOSIS — G4710 Hypersomnia, unspecified: Secondary | ICD-10-CM

## 2019-02-10 DIAGNOSIS — G4719 Other hypersomnia: Secondary | ICD-10-CM

## 2019-02-10 DIAGNOSIS — G479 Sleep disorder, unspecified: Secondary | ICD-10-CM

## 2019-02-10 DIAGNOSIS — R0683 Snoring: Secondary | ICD-10-CM

## 2019-02-10 DIAGNOSIS — F331 Major depressive disorder, recurrent, moderate: Secondary | ICD-10-CM | POA: Diagnosis not present

## 2019-02-10 DIAGNOSIS — Z9189 Other specified personal risk factors, not elsewhere classified: Secondary | ICD-10-CM

## 2019-02-10 DIAGNOSIS — G4763 Sleep related bruxism: Secondary | ICD-10-CM

## 2019-02-10 DIAGNOSIS — R519 Headache, unspecified: Secondary | ICD-10-CM

## 2019-02-17 DIAGNOSIS — F331 Major depressive disorder, recurrent, moderate: Secondary | ICD-10-CM | POA: Diagnosis not present

## 2019-02-17 NOTE — Procedures (Signed)
Patient Information     First Name: Jean DikeJennifer Last Name: Morene RankinsMedlin ID: [id number]  Birth Date: Dec 15, 1980 Age: [age] Gender: Female  Referring Provider: Myles LippsSantiago, Irma M, MD BMI: 32.2 (W=225 lb, H=5' 10'')  Neck Circ.:  15.5" Epworth:  9/24   Sleep Study Information    Study Date: Feb 10, 2019 S/H/A Version: 5.1.76.4 / 4.1.1528 / 3876   History:  38 year old woman with a history of anxiety, depression, and obesity, who reports snoring and excessive daytime somnolence. Summary & Diagnosis:              Primary snoring Recommendations:     This home sleep test does not demonstrate any significant obstructive or central sleep disordered breathing, with an AHI of 2.7/hour, O2 nadir of 92%. Some snoring was noted. Other causes of the patient's symptoms, including circadian rhythm disturbances, an underlying mood disorder, medication effect and/or an underlying medical problem cannot be ruled out based on this test. Clinical correlation is recommended. The patient should be cautioned not to drive, work at heights, or operate dangerous or heavy equipment when tired or sleepy. Review and reiteration of good sleep hygiene measures should be pursued with any patient. The patient can follow up with her referring provider, who will be notified of the test results. An appointment in sleep clinic can be made as necessary.   I certify that I have reviewed the raw data recording prior to the issuance of this report in accordance with the standards of the American Academy of Sleep Medicine (AASM).  Huston FoleySaima Mata Rowen, MD, PhD Diplomat, ABPN (Neurology and Sleep)                 Sleep Summary  Oxygen Saturation Statistics   Start Study Time: End Study Time: Total Recording Time:  10:05:37 PM     6:25:30 AM   8 hrs, 19 min  Total Sleep Time % REM of Sleep Time:  7 hrs, 26 min 27.5    Mean:  96                     Minimum:  92                Maximum:  99  Mean of Desaturations Nadirs (%): 94      Oxygen Desaturation %: 4-9 10-20 >20 Total  Events Number Total  8 100.0  0 0.0  0 0.0  8 100.0  Oxygen Saturation: <90 <=88 <85 <80 <70  Duration (minutes): Sleep % 0.0 0.0 0.0 0.0 0.0 0.0 0.0 0.0 0.0 0.0     Respiratory Indices      Total Events REM NREM All Night  pRDI:  64  pAHI:  20 ODI:  8  pAHIc:  6  % CSR: 0.0 8.8 1.5 0.5 1.0 8.5 3.2 1.3 1.2 8.6 2.7 1.1 1.2       Pulse Rate Statistics during Sleep (BPM)      Mean: 72  Minimum: 50  Maximum: 108    Indices are calculated using technically valid sleep time of  7 hrs, 25 min. Central-Indices are calculated using technically valid sleep time of  5  hrs, 13 min. pRDI/pAHI are calculated using oxi desaturations ? 3%  Body Position Statistics  Position Supine Prone Right Left Non-Supine  Sleep (min) 144.5 0.0 166.4 136.0 302.4  Sleep % 32.3 0.0 37.2 30.4 67.7  pRDI 12.1 N/A 5.1 9.3 7.0  pAHI 6.7 N/A 0.0 1.8 0.8  ODI 2.9 N/A  0.0 0.4 0.2     Snoring Statistics Snoring Level (dB) >40 >50 >60 >70 >80 >Threshold (45)  Sleep (min) 251.6 3.2 0.9 0.0 0.0 9.4  Sleep % 56.3 0.7 0.2 0.0 0.0 2.1    Mean: 41 dB Sleep Stages Chart

## 2019-02-17 NOTE — Progress Notes (Signed)
Patient referred by Dr. Leretha Pol, seen by me on 12/11/18, HST on 02/10/19.   Please call and notify the patient that the recent home sleep test did not show any significant obstructive sleep apnea (AHI 2.7/hour, O2 nadir 92%). Some snoring was noted, for which avoiding the supine sleep position and weight loss will likely suffice as treatment.  Patient can follow up with her PCP.  Please remind patient to try to maintain good sleep hygiene, which means: Keep a regular sleep and wake schedule and make enough time for sleep (7 1/2 to 8 1/2 hours for the average adult), try not to exercise or have a meal within 2 hours of your bedtime, try to keep your bedroom conducive for sleep, that is, cool and dark, without light distractors such as an illuminated alarm clock, and refrain from watching TV right before sleep or in the middle of the night and do not keep the TV or radio on during the night. If a nightlight is used, have it away from the visual field. Also, try not to use or play on electronic devices at bedtime, such as your cell phone, tablet PC or laptop. If you like to read at bedtime on an electronic device, try to dim the background light as much as possible. Do not eat in the middle of the night. Keep pets away from the bedroom environment. For stress relief, try meditation, deep breathing exercises (there are many books and CDs available), a white noise machine or fan can help to diffuse other noise distractors, such as traffic noise. Do not drink alcohol before bedtime, as it can disturb sleep and cause middle of the night awakenings. Never mix alcohol and sedating medications! Avoid narcotic pain medication close to bedtime, as opioids/narcotics can suppress breathing drive and breathing effort.    Huston Foley, MD, PhD [facility])

## 2019-02-18 ENCOUNTER — Telehealth: Payer: Self-pay

## 2019-02-18 NOTE — Telephone Encounter (Signed)
I called pt. I advised pt that Dr. Frances Furbish reviewed pt's sleep study and found that pt did not show any significant osa but there was some snoring noted. Dr. Frances Furbish recommends that pt avoid supine sleep and consider weight loss. I reviewed sleep hygiene recommendations with the pt, including trying to keep a regular sleep wake schedule, avoiding electronics in the bedroom, keeping the bedroom cool, dark, and quiet, and avoiding eating or exercising within 2 hours of bedtime as well as eating in the middle of the night. I advised pt to keep pets out of the bedroom. I discussed with pt the importance of stress relief and to try meditation, deep breathing exercises, and/or a white noise machine or fan to diffuse other noise distractors. I advised pt to not drink alcohol before bedtime and to never mix alcohol and sedating medications. Pt was advised to avoid narcotic pain medication close to bedtime. I advised pt that a copy of these sleep study results will be sent to Dr. Leretha Pol. Pt verbalized understanding of results. Pt had no questions at this time but was encouraged to call back if questions arise.

## 2019-02-18 NOTE — Telephone Encounter (Signed)
-----   Message from Huston Foley, MD sent at 02/17/2019  8:48 AM EDT ----- Patient referred by Dr. Leretha Pol, seen by me on 12/11/18, HST on 02/10/19.   Please call and notify the patient that the recent home sleep test did not show any significant obstructive sleep apnea (AHI 2.7/hour, O2 nadir 92%). Some snoring was noted, for which avoiding the supine sleep position and weight loss will likely suffice as treatment.  Patient can follow up with her PCP.  Please remind patient to try to maintain good sleep hygiene, which means: Keep a regular sleep and wake schedule and make enough time for sleep (7 1/2 to 8 1/2 hours for the average adult), try not to exercise or have a meal within 2 hours of your bedtime, try to keep your bedroom conducive for sleep, that is, cool and dark, without light distractors such as an illuminated alarm clock, and refrain from watching TV right before sleep or in the middle of the night and do not keep the TV or radio on during the night. If a nightlight is used, have it away from the visual field. Also, try not to use or play on electronic devices at bedtime, such as your cell phone, tablet PC or laptop. If you like to read at bedtime on an electronic device, try to dim the background light as much as possible. Do not eat in the middle of the night. Keep pets away from the bedroom environment. For stress relief, try meditation, deep breathing exercises (there are many books and CDs available), a white noise machine or fan can help to diffuse other noise distractors, such as traffic noise. Do not drink alcohol before bedtime, as it can disturb sleep and cause middle of the night awakenings. Never mix alcohol and sedating medications! Avoid narcotic pain medication close to bedtime, as opioids/narcotics can suppress breathing drive and breathing effort.    Huston Foley, MD, PhD [facility])

## 2019-02-24 DIAGNOSIS — F331 Major depressive disorder, recurrent, moderate: Secondary | ICD-10-CM | POA: Diagnosis not present

## 2019-03-03 DIAGNOSIS — F331 Major depressive disorder, recurrent, moderate: Secondary | ICD-10-CM | POA: Diagnosis not present

## 2019-03-10 DIAGNOSIS — F331 Major depressive disorder, recurrent, moderate: Secondary | ICD-10-CM | POA: Diagnosis not present

## 2019-03-17 DIAGNOSIS — F331 Major depressive disorder, recurrent, moderate: Secondary | ICD-10-CM | POA: Diagnosis not present

## 2019-03-24 DIAGNOSIS — F331 Major depressive disorder, recurrent, moderate: Secondary | ICD-10-CM | POA: Diagnosis not present

## 2019-03-31 DIAGNOSIS — F331 Major depressive disorder, recurrent, moderate: Secondary | ICD-10-CM | POA: Diagnosis not present

## 2019-04-07 DIAGNOSIS — F331 Major depressive disorder, recurrent, moderate: Secondary | ICD-10-CM | POA: Diagnosis not present

## 2019-04-12 IMAGING — DX DG CHEST 1V
1 series · 1 of 1 positions shown · non-contrast
Comparison: Portable exam 5129 hours without priors for comparison

CLINICAL DATA: Syncope, dizziness, lightheaded, diaphoretic

EXAM:
CHEST  1 VIEW

[chest ap]
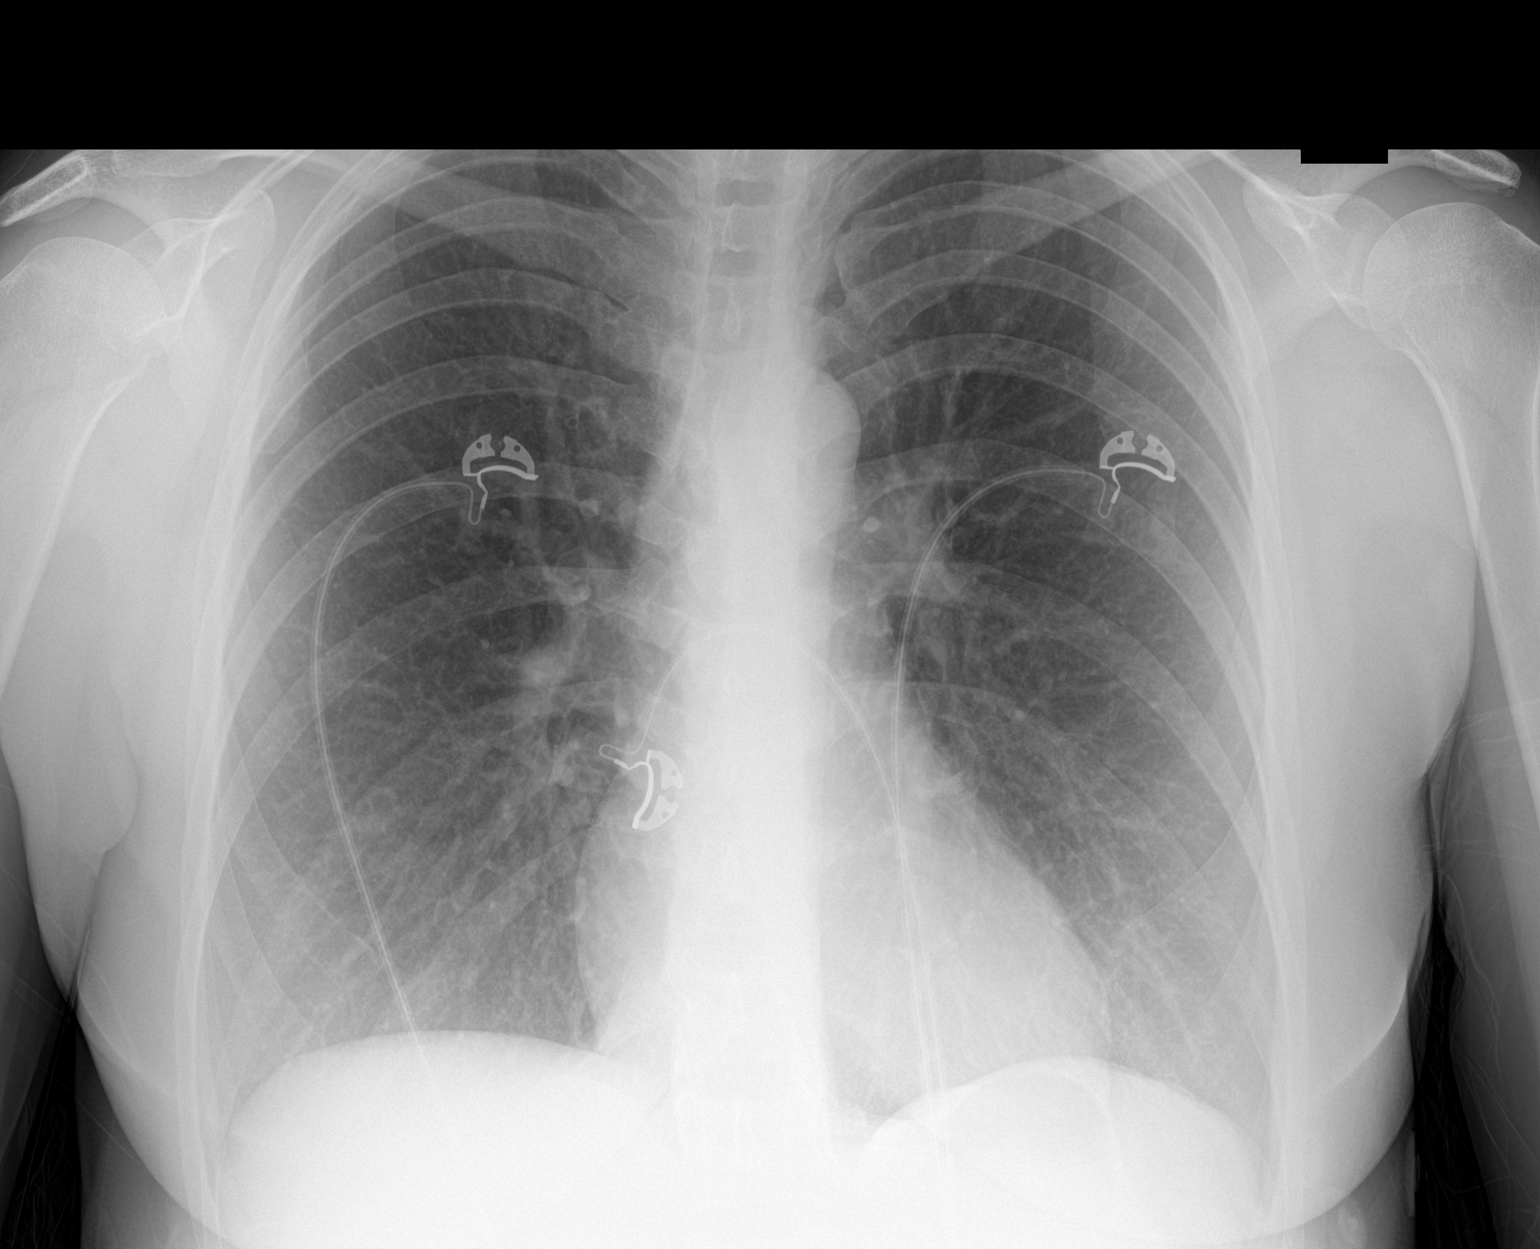

[1 of 1 positions shown; findings below may reference images not displayed]

FINDINGS: Normal heart size, mediastinal contours, and pulmonary vascularity.

Lungs clear.

No pleural effusion or pneumothorax.

Bones unremarkable.
IMPRESSION: No acute abnormalities.

## 2019-04-18 ENCOUNTER — Telehealth: Payer: BLUE CROSS/BLUE SHIELD | Admitting: Nurse Practitioner

## 2019-04-18 DIAGNOSIS — L239 Allergic contact dermatitis, unspecified cause: Secondary | ICD-10-CM

## 2019-04-18 MED ORDER — PREDNISONE 10 MG (21) PO TBPK: ORAL_TABLET | ORAL | 0 refills | Status: DC

## 2019-04-18 NOTE — Progress Notes (Signed)
E Visit for Rash  We are sorry that you are not feeling well. Here is how we plan to help!  Based on what you shared with me it looks like you have contact dermatitis.  Contact dermatitis is a skin rash caused by something that touches the skin and causes irritation or inflammation.  Your skin may be red, swollen, dry, cracked, and itch.  The rash should go away in a few days but can last a few weeks.  If you get a rash, it's important to figure out what caused it so the irritant can be avoided in the future.      Prednisone 10 mg daily for 6 days (see taper instructions below)  Directions for 6 day taper: Day 1: 2 tablets before breakfast, 1 after both lunch & dinner and 2 at bedtime Day 2: 1 tab before breakfast, 1 after both lunch & dinner and 2 at bedtime Day 3: 1 tab at each meal & 1 at bedtime Day 4: 1 tab at breakfast, 1 at lunch, 1 at bedtime Day 5: 1 tab at breakfast & 1 tab at bedtime Day 6: 1 tab at breakfast    HOME CARE:   Take cool showers and avoid direct sunlight.  Apply cool compress or wet dressings.  Take a bath in an oatmeal bath.  Sprinkle content of one Aveeno packet under running faucet with comfortably warm water.  Bathe for 15-20 minutes, 1-2 times daily.  Pat dry with a towel. Do not rub the rash.  Use hydrocortisone cream.  Take an antihistamine like Benadryl for widespread rashes that itch.  The adult dose of Benadryl is 25-50 mg by mouth 4 times daily.  Caution:  This type of medication may cause sleepiness.  Do not drink alcohol, drive, or operate dangerous machinery while taking antihistamines.  Do not take these medications if you have prostate enlargement.  Read package instructions thoroughly on all medications that you take.  GET HELP RIGHT AWAY IF:   Symptoms don't go away after treatment.  Severe itching that persists.  If you rash spreads or swells.  If you rash begins to smell.  If it blisters and opens or develops a yellow-brown  crust.  You develop a fever.  You have a sore throat.  You become short of breath.  MAKE SURE YOU:  Understand these instructions. Will watch your condition. Will get help right away if you are not doing well or get worse.  Thank you for choosing an e-visit. Your e-visit answers were reviewed by a board certified advanced clinical practitioner to complete your personal care plan. Depending upon the condition, your plan could have included both over the counter or prescription medications. Please review your pharmacy choice. Be sure that the pharmacy you have chosen is open so that you can pick up your prescription now.  If there is a problem you may message your provider in MyChart to have the prescription routed to another pharmacy. Your safety is important to us. If you have drug allergies check your prescription carefully.   For the next 24 hours, you can use MyChart to ask questions about today's visit, request a non-urgent call back, or ask for a work or school excuse from your e-visit provider. You will get an email in the next two days asking about your experience. I hope that your e-visit has been valuable and will speed your recovery.    5-10 minutes spent reviewing and documenting in chart.   

## 2019-04-21 DIAGNOSIS — F331 Major depressive disorder, recurrent, moderate: Secondary | ICD-10-CM | POA: Diagnosis not present

## 2019-04-28 DIAGNOSIS — F331 Major depressive disorder, recurrent, moderate: Secondary | ICD-10-CM | POA: Diagnosis not present

## 2019-05-05 DIAGNOSIS — F331 Major depressive disorder, recurrent, moderate: Secondary | ICD-10-CM | POA: Diagnosis not present

## 2019-05-26 DIAGNOSIS — F331 Major depressive disorder, recurrent, moderate: Secondary | ICD-10-CM | POA: Diagnosis not present

## 2019-06-09 DIAGNOSIS — F331 Major depressive disorder, recurrent, moderate: Secondary | ICD-10-CM | POA: Diagnosis not present

## 2019-06-16 DIAGNOSIS — F331 Major depressive disorder, recurrent, moderate: Secondary | ICD-10-CM | POA: Diagnosis not present

## 2019-06-23 DIAGNOSIS — F331 Major depressive disorder, recurrent, moderate: Secondary | ICD-10-CM | POA: Diagnosis not present

## 2019-06-30 DIAGNOSIS — F331 Major depressive disorder, recurrent, moderate: Secondary | ICD-10-CM | POA: Diagnosis not present

## 2019-07-07 DIAGNOSIS — F331 Major depressive disorder, recurrent, moderate: Secondary | ICD-10-CM | POA: Diagnosis not present

## 2019-07-10 ENCOUNTER — Telehealth: Payer: BC Managed Care – PPO | Admitting: Physician Assistant

## 2019-07-10 DIAGNOSIS — R195 Other fecal abnormalities: Secondary | ICD-10-CM | POA: Diagnosis not present

## 2019-07-10 NOTE — Progress Notes (Signed)
I have spent 5 minutes in review of e-visit questionnaire, review and updating patient chart, medical decision making and response to patient.   Milla Wahlberg Cody Kanoa Phillippi, PA-C    

## 2019-07-10 NOTE — Progress Notes (Signed)
We are sorry that you are not feeling well.  Here is how we plan to help!  Based on what you have shared with me it looks like you either have the start of a mild Acute Infectious Diarrhea or potentially developing symptoms of IBS.   Most cases of acute diarrhea are due to infections with virus and bacteria and are self-limited conditions lasting less than 14 days.  For your symptoms you may take Imodium 2 mg tablets that are over the counter at your local pharmacy. Take two tablet now and then one after each loose stool up to 6 a day.  Antibiotics are not needed for most people with diarrhea.  If there are no other symptoms and no known exposure to COVID I do not see reason for testing presently.   HOME CARE  We recommend changing your diet to help with your symptoms for the next few days.  Drink plenty of fluids that contain water salt and sugar. Sports drinks such as Gatorade may help.   You may try broths, soups, bananas, applesauce, soft breads, mashed potatoes or crackers.   You are considered infectious for as long as the diarrhea continues. Hand washing or use of alcohol based hand sanitizers is recommend.  It is best to stay out of work or school until your symptoms stop.   GET HELP RIGHT AWAY  If you have dark yellow colored urine or do not pass urine frequently you should drink more fluids.    If your symptoms worsen   If you feel like you are going to pass out (faint)  You have a new problem  MAKE SURE YOU   Understand these instructions.  Will watch your condition.  Will get help right away if you are not doing well or get worse.  Your e-visit answers were reviewed by a board certified advanced clinical practitioner to complete your personal care plan.  Depending on the condition, your plan could have included both over the counter or prescription medications.  If there is a problem please reply  once you have received a response from your provider.  Your  safety is important to [place].  If you have drug allergies check your prescription carefully.    You can use MyChart to ask questions about today's visit, request a non-urgent call back, or ask for a work or school excuse for 24 hours related to this e-Visit. If it has been greater than 24 hours you will need to follow up with your provider, or enter a new e-Visit to address those concerns.   You will get an e-mail in the next two days asking about your experience.  I hope that your e-visit has been valuable and will speed your recovery. Thank you for using e-visits.

## 2019-07-14 DIAGNOSIS — F331 Major depressive disorder, recurrent, moderate: Secondary | ICD-10-CM | POA: Diagnosis not present

## 2019-07-21 DIAGNOSIS — F331 Major depressive disorder, recurrent, moderate: Secondary | ICD-10-CM | POA: Diagnosis not present

## 2019-07-28 DIAGNOSIS — F331 Major depressive disorder, recurrent, moderate: Secondary | ICD-10-CM | POA: Diagnosis not present

## 2019-08-05 DIAGNOSIS — F331 Major depressive disorder, recurrent, moderate: Secondary | ICD-10-CM | POA: Diagnosis not present

## 2019-08-11 DIAGNOSIS — F331 Major depressive disorder, recurrent, moderate: Secondary | ICD-10-CM | POA: Diagnosis not present

## 2019-08-25 DIAGNOSIS — F331 Major depressive disorder, recurrent, moderate: Secondary | ICD-10-CM | POA: Diagnosis not present

## 2019-09-01 DIAGNOSIS — F331 Major depressive disorder, recurrent, moderate: Secondary | ICD-10-CM | POA: Diagnosis not present

## 2019-09-04 ENCOUNTER — Encounter: Payer: Self-pay | Admitting: Family Medicine

## 2019-09-08 ENCOUNTER — Other Ambulatory Visit: Payer: Self-pay

## 2019-09-08 ENCOUNTER — Telehealth (INDEPENDENT_AMBULATORY_CARE_PROVIDER_SITE_OTHER): Payer: BC Managed Care – PPO | Admitting: Family Medicine

## 2019-09-08 DIAGNOSIS — F320 Major depressive disorder, single episode, mild: Secondary | ICD-10-CM

## 2019-09-08 DIAGNOSIS — F32A Depression, unspecified: Secondary | ICD-10-CM

## 2019-09-08 DIAGNOSIS — F331 Major depressive disorder, recurrent, moderate: Secondary | ICD-10-CM | POA: Diagnosis not present

## 2019-09-08 MED ORDER — SERTRALINE HCL 100 MG PO TABS: 100.0000 mg | ORAL_TABLET | Freq: Every day | ORAL | 5 refills | Status: DC

## 2019-09-08 NOTE — Progress Notes (Signed)
Virtual Visit Note  I connected with patient on 09/08/19 at 524pm by phone and verified that I am speaking with the correct person using two identifiers. Jalise Zawistowski is currently located at home and patient is currently with them during visit. The provider, Rutherford Guys, MD is located in their office at time of visit.  I discussed the limitations, risks, security and privacy concerns of performing an evaluation and management service by telephone and the availability of in person appointments. I also discussed with the patient that there may be a patient responsible charge related to this service. The patient expressed understanding and agreed to proceed.   CC: depression  HPI ? Last OV jan 2020 Had normal sleep study  She reports that for most she is doing really well She has moved to [place], [place], close to in laws Still working at [employer], mostly remote work Depression is overall improved, but she would like to increase dose Still struggles with motivation, sleeping a lot, irritability improved and better Has been seeing weekly therapist virtually in [place] - very helpful Has stopped drinking all etoh, sleeping much better Denies any mania  Has stopped nuvaring as trying to conceive She is taking prenatal vitamins Needs to establish obgyn  Depression screen [facility] 2/9 09/08/2019 09/08/2019 10/30/2018  Decreased Interest 1 1 1   Down, Depressed, Hopeless 1 1 1   PHQ - 2 Score 2 2 2   Altered sleeping 1 1 2   Tired, decreased energy 1 2 1   Change in appetite 1 1 1   Feeling bad or failure about yourself  1 1 2   Trouble concentrating 1 1 0  Moving slowly or fidgety/restless 1 1 0  Suicidal thoughts 0 0 0  PHQ-9 Score 8 9 8   Difficult doing work/chores Not difficult at all Not difficult at all Somewhat difficult    No Known Allergies  Prior to Admission medications   Medication Sig Start Date End Date Taking? Authorizing Provider  cetirizine (ZYRTEC) 10 MG tablet Take  10 mg by mouth daily.   Yes [provider]  sertraline (ZOLOFT) 50 MG tablet Take 1 tablet (50 mg total) by mouth daily. 02/05/19  Yes Rutherford Guys, MD    Past Medical History:  Diagnosis Date  . Depression     No past surgical history on file.  Social History   Tobacco Use  . Smoking status: Never Smoker  . Smokeless tobacco: Never Used  Substance Use Topics  . Alcohol use: No    Family History  Problem Relation Age of Onset  . Diabetes Sister   . Bipolar disorder Brother   . Bipolar disorder Maternal Grandmother   . Diabetes Maternal Grandfather   . Heart disease Paternal Grandmother   . Elevated Lipids Paternal Grandmother   . Lung cancer Paternal Grandfather   . Gestational diabetes Sister     ROS Per hpi  Objective  Vitals as reported by the patient: none   ASSESSMENT and PLAN  1. Mild depression ([place]) Improved but not controlled. Increasing sertraline. Reviewed r/se/b sp in setting of pregnancy. Cont with counseling.  - sertraline (ZOLOFT) 100 MG tablet; Take 1 tablet (100 mg total) by mouth daily.  FOLLOW-UP: 3 months   The above assessment and management plan was discussed with the patient. The patient verbalized understanding of and has agreed to the management plan. Patient is aware to call the clinic if symptoms persist or worsen. Patient is aware when to return to the clinic for a  follow-up visit. Patient educated on when it is appropriate to go to the emergency department.    I provided 13 minutes of non-face-to-face time during this encounter.  [name], MD Primary Care at [facility] [address] [place], [place] [address] Ph.  [phone] Fax [phone]

## 2019-09-08 NOTE — Progress Notes (Signed)
Refill on zoloft medication depression screening completed, pharmacy verified. No longer using the nuvaring. No other form of bc at this time. Nx ov will be for pap and flu vaccine.

## 2019-09-15 DIAGNOSIS — F331 Major depressive disorder, recurrent, moderate: Secondary | ICD-10-CM | POA: Diagnosis not present

## 2019-09-29 DIAGNOSIS — F331 Major depressive disorder, recurrent, moderate: Secondary | ICD-10-CM | POA: Diagnosis not present

## 2019-10-20 DIAGNOSIS — F331 Major depressive disorder, recurrent, moderate: Secondary | ICD-10-CM | POA: Diagnosis not present

## 2019-11-03 DIAGNOSIS — F331 Major depressive disorder, recurrent, moderate: Secondary | ICD-10-CM | POA: Diagnosis not present

## 2019-11-17 DIAGNOSIS — F331 Major depressive disorder, recurrent, moderate: Secondary | ICD-10-CM | POA: Diagnosis not present

## 2019-12-01 DIAGNOSIS — F331 Major depressive disorder, recurrent, moderate: Secondary | ICD-10-CM | POA: Diagnosis not present

## 2019-12-02 DIAGNOSIS — F331 Major depressive disorder, recurrent, moderate: Secondary | ICD-10-CM | POA: Diagnosis not present

## 2019-12-15 DIAGNOSIS — F331 Major depressive disorder, recurrent, moderate: Secondary | ICD-10-CM | POA: Diagnosis not present

## 2019-12-29 DIAGNOSIS — F331 Major depressive disorder, recurrent, moderate: Secondary | ICD-10-CM | POA: Diagnosis not present

## 2019-12-30 ENCOUNTER — Telehealth (INDEPENDENT_AMBULATORY_CARE_PROVIDER_SITE_OTHER): Payer: BC Managed Care – PPO | Admitting: Family Medicine

## 2019-12-30 ENCOUNTER — Encounter: Payer: Self-pay | Admitting: Family Medicine

## 2019-12-30 ENCOUNTER — Other Ambulatory Visit: Payer: Self-pay

## 2019-12-30 DIAGNOSIS — F321 Major depressive disorder, single episode, moderate: Secondary | ICD-10-CM

## 2019-12-30 MED ORDER — SERTRALINE HCL 100 MG PO TABS: 200.0000 mg | ORAL_TABLET | Freq: Every day | ORAL | 5 refills | Status: DC

## 2019-12-30 NOTE — Progress Notes (Signed)
Virtual Visit Note  I connected with patient on 12/30/19 at 530pm by video via epic and verified that I am speaking with the correct person using two identifiers. Jean [name] is currently located at home and patient is currently with them during visit. The provider, Rutherford Guys, MD is located in their office at time of visit.  I discussed the limitations, risks, security and privacy concerns of performing an evaluation and management service by telephone and the availability of in person appointments. I also discussed with the patient that there may be a patient responsible charge related to this service. The patient expressed understanding and agreed to proceed.   I provided 18 minutes of non-face-to-face time during this encounter.  CC: depression  HPI ? Last OV nov 2020 - increased sertraline to 100mg  She reports tolerating increased sertraline She continues to do counseling, now twice a month She reports anxiety is much better but still really struggling with anhedonia, spending as much time as possible sleeping or in bed She denies any SI Still considering pregnancy  Depression screen [facility] 2/9 12/30/2019 09/08/2019 09/08/2019  Decreased Interest 2 1 1   Down, Depressed, Hopeless 2 1 1   PHQ - 2 Score 4 2 2   Altered sleeping 3 1 1   Tired, decreased energy 3 1 2   Change in appetite 1 1 1   Feeling bad or failure about yourself  1 1 1   Trouble concentrating 1 1 1   Moving slowly or fidgety/restless 1 1 1   Suicidal thoughts 0 0 0  PHQ-9 Score 14 8 9   Difficult doing work/chores - Not difficult at all Not difficult at all    No Known Allergies  Prior to Admission medications   Medication Sig Start Date End Date Taking? Authorizing Provider  cetirizine (ZYRTEC) 10 MG tablet Take 10 mg by mouth daily.    [provider]  sertraline (ZOLOFT) 100 MG tablet Take 1 tablet (100 mg total) by mouth daily. 09/08/19   Rutherford Guys, MD    Past Medical History:   Diagnosis Date  . Depression     No past surgical history on file.  Social History   Tobacco Use  . Smoking status: Never Smoker  . Smokeless tobacco: Never Used  Substance Use Topics  . Alcohol use: No    Family History  Problem Relation Age of Onset  . Diabetes Sister   . Bipolar disorder Brother   . Bipolar disorder Maternal Grandmother   . Diabetes Maternal Grandfather   . Heart disease Paternal Grandmother   . Elevated Lipids Paternal Grandmother   . Lung cancer Paternal Grandfather   . Gestational diabetes Sister     ROS Per hpi  Objective  Vitals as reported by the patient: GEN: AAOx3, NAD HEENT: [place]/AT, pupils are symmetrical, EOMI, non-icteric sclera Resp: breathing comfortably, speaking in full sentences Skin: no rashes noted, no pallor Psych: good eye contact, normal mood and affect   ASSESSMENT and PLAN  1. Moderate major depression (HCC) Recurrent. Not controlled. Discussed treatment options, increasing sertraline to max dose. Consider changing to celexa or lexapro (she has been on lexapro in the past). Discussed wellbutrin as currently not pregnant. - sertraline (ZOLOFT) 100 MG tablet; Take 2 tablets (200 mg total) by mouth daily.  FOLLOW-UP: 6 weeks   The above assessment and management plan was discussed with the patient. The patient verbalized understanding of and has agreed to the management plan. Patient is aware to call the clinic if symptoms persist or  worsen. Patient is aware when to return to the clinic for a follow-up visit. Patient educated on when it is appropriate to go to the emergency department.     [name], MD Primary Care at [facility] [address] [place], [place] [address] Ph.  [phone] Fax [phone]

## 2020-01-06 DIAGNOSIS — F331 Major depressive disorder, recurrent, moderate: Secondary | ICD-10-CM | POA: Diagnosis not present

## 2020-01-12 DIAGNOSIS — F331 Major depressive disorder, recurrent, moderate: Secondary | ICD-10-CM | POA: Diagnosis not present

## 2020-01-20 DIAGNOSIS — F331 Major depressive disorder, recurrent, moderate: Secondary | ICD-10-CM | POA: Diagnosis not present

## 2020-02-02 DIAGNOSIS — F331 Major depressive disorder, recurrent, moderate: Secondary | ICD-10-CM | POA: Diagnosis not present

## 2020-02-09 DIAGNOSIS — F331 Major depressive disorder, recurrent, moderate: Secondary | ICD-10-CM | POA: Diagnosis not present

## 2020-02-12 ENCOUNTER — Telehealth: Payer: BC Managed Care – PPO | Admitting: Family Medicine

## 2020-02-12 ENCOUNTER — Other Ambulatory Visit: Payer: Self-pay

## 2020-02-16 ENCOUNTER — Encounter: Payer: Self-pay | Admitting: Family Medicine

## 2020-02-17 ENCOUNTER — Encounter: Payer: Self-pay | Admitting: Family Medicine

## 2020-02-17 ENCOUNTER — Telehealth (INDEPENDENT_AMBULATORY_CARE_PROVIDER_SITE_OTHER): Payer: BC Managed Care – PPO | Admitting: Family Medicine

## 2020-02-17 ENCOUNTER — Other Ambulatory Visit: Payer: Self-pay

## 2020-02-17 DIAGNOSIS — F321 Major depressive disorder, single episode, moderate: Secondary | ICD-10-CM

## 2020-02-17 MED ORDER — BUPROPION HCL ER (XL) 150 MG PO TB24: 150.0000 mg | ORAL_TABLET | Freq: Every day | ORAL | 3 refills | Status: DC

## 2020-02-17 NOTE — Progress Notes (Signed)
Virtual Visit Note  I connected with patient on 02/17/20 at 545pm by video epic and verified that I am speaking with the correct person using two identifiers. Jean [name] is currently located at home and patient is currently with them during visit. The provider, [name], MD is located in their office at time of visit.  I discussed the limitations, risks, security and privacy concerns of performing an evaluation and management service by telephone and the availability of in person appointments. I also discussed with the patient that there may be a patient responsible charge related to this service. The patient expressed understanding and agreed to proceed.   I provided 15 minutes of non-face-to-face time during this encounter.  CC: depression  HPI ? Last OV march 2021 - increased sertraline to 200mg  Continues to work with counseling Feels that things are getting a little easier Anxiety has decreased  Continues to struggle with sleeping to much Feels that communication is getting easier  She has resumed nuvaring, not planning on anymore children at this time phq9 noted  Depression screen [facility] 2/9 02/17/2020 12/30/2019 09/08/2019 09/08/2019 10/30/2018  Decreased Interest 1 2 1 1 1   Down, Depressed, Hopeless 1 2 1 1 1   PHQ - 2 Score 2 4 2 2 2   Altered sleeping 2 3 1 1 2   Tired, decreased energy 2 3 1 2 1   Change in appetite 1 1 1 1 1   Feeling bad or failure about yourself  0 1 1 1 2   Trouble concentrating 0 1 1 1  0  Moving slowly or fidgety/restless 0 1 1 1  0  Suicidal thoughts 0 0 0 0 0  PHQ-9 Score 7 14 8 9 8   Difficult doing work/chores Somewhat difficult - Not difficult at all Not difficult at all Somewhat difficult    No Known Allergies  Prior to Admission medications   Medication Sig Start Date End Date Taking? Authorizing Provider  cetirizine (ZYRTEC) 10 MG tablet Take 10 mg by mouth daily.   Yes [provider]  sertraline (ZOLOFT) 100 MG tablet Take  2 tablets (200 mg total) by mouth daily. 12/30/19  Yes , MD    Past Medical History:  Diagnosis Date  . Depression     No past surgical history on file.  Social History   Tobacco Use  . Smoking status: Never Smoker  . Smokeless tobacco: Never Used  Substance Use Topics  . Alcohol use: No    Family History  Problem Relation Age of Onset  . Diabetes Sister   . Bipolar disorder Brother   . Bipolar disorder Maternal Grandmother   . Diabetes Maternal Grandfather   . Heart disease Paternal Grandmother   . Elevated Lipids Paternal Grandmother   . Lung cancer Paternal Grandfather   . Gestational diabetes Sister     ROS Per hpi  Objective  Vitals as reported by the patient: none   ASSESSMENT and PLAN  1. Moderate major depression (HCC) Improved but not totally controlled. Augmenting with wellbutrin. Reviewed r/se/b. Cont with counseling  Other orders - buPROPion (WELLBUTRIN XL) 150 MG 24 hr tablet; Take 1 tablet (150 mg total) by mouth daily.  FOLLOW-UP: 6 weeks   The above assessment and management plan was discussed with the patient. The patient verbalized understanding of and has agreed to the management plan. Patient is aware to call the clinic if symptoms persist or worsen. Patient is aware when to return to the clinic for a follow-up visit.  Patient educated on when it is appropriate to go to the emergency department.     Rutherford Guys, MD Primary Care at [place], [place] [address] Ph.  [phone] Fax [phone]

## 2020-02-23 DIAGNOSIS — F331 Major depressive disorder, recurrent, moderate: Secondary | ICD-10-CM | POA: Diagnosis not present

## 2020-02-25 ENCOUNTER — Encounter: Payer: Self-pay | Admitting: Family Medicine

## 2020-02-25 NOTE — Telephone Encounter (Signed)
Patient is experiencing some Sweating , dizziness, nausea, and shaking after starting Wellbutrin. Patient states she had to get someone to care for her toddler because she felt that she would faint. 2 hours later she felt better by slowly drinking water and had some Pedialyte. Patient wondered should she continue taking this medication?

## 2020-03-08 DIAGNOSIS — F331 Major depressive disorder, recurrent, moderate: Secondary | ICD-10-CM | POA: Diagnosis not present

## 2020-03-24 DIAGNOSIS — F331 Major depressive disorder, recurrent, moderate: Secondary | ICD-10-CM | POA: Diagnosis not present

## 2020-04-12 DIAGNOSIS — F331 Major depressive disorder, recurrent, moderate: Secondary | ICD-10-CM | POA: Diagnosis not present

## 2020-04-26 DIAGNOSIS — F331 Major depressive disorder, recurrent, moderate: Secondary | ICD-10-CM | POA: Diagnosis not present

## 2020-05-07 ENCOUNTER — Other Ambulatory Visit: Payer: Self-pay

## 2020-05-07 ENCOUNTER — Encounter: Payer: Self-pay | Admitting: Family Medicine

## 2020-05-07 ENCOUNTER — Telehealth (INDEPENDENT_AMBULATORY_CARE_PROVIDER_SITE_OTHER): Payer: BC Managed Care – PPO | Admitting: Family Medicine

## 2020-05-07 DIAGNOSIS — F321 Major depressive disorder, single episode, moderate: Secondary | ICD-10-CM | POA: Diagnosis not present

## 2020-05-07 MED ORDER — BUPROPION HCL ER (XL) 150 MG PO TB24: 150.0000 mg | ORAL_TABLET | Freq: Every day | ORAL | 4 refills | Status: AC

## 2020-05-07 MED ORDER — SERTRALINE HCL 100 MG PO TABS: 200.0000 mg | ORAL_TABLET | Freq: Every day | ORAL | 4 refills | Status: AC

## 2020-05-07 NOTE — Progress Notes (Signed)
Virtual Visit Note  I connected with patient on 05/07/20 at 513pm by video epic and verified that I am speaking with the correct person using two identifiers. Jean [name] is currently located at parked car and patient is currently with them during visit. The provider, [name], MD is located in their office at time of visit.  I discussed the limitations, risks, security and privacy concerns of performing an evaluation and management service by telephone and the availability of in person appointments. I also discussed with the patient that there may be a patient responsible charge related to this service. The patient expressed understanding and agreed to proceed.   I provided 9 minutes of non-face-to-face time during this encounter.  Chief Complaint  Patient presents with  . Depression    following up on wellbutrin/ patient doing well     HPI ? Last OV may 2021 - added wellbutrin  Mood is sign better, anhedonia resolved  Has not felt this good in a long time Full range of emotions No more sleeping for 3-4 hours in the afternoon Life in general is not a struggle anymore Very fleeting rare dizziness since started wellbutrin - tolerable Continues to do counseling In final stages of interview process, new job much closer to home  No Known Allergies  Prior to Admission medications   Medication Sig Start Date End Date Taking? Authorizing Provider  buPROPion (WELLBUTRIN XL) 150 MG 24 hr tablet Take 1 tablet (150 mg total) by mouth daily. 02/17/20  Yes [name], MD  sertraline (ZOLOFT) 100 MG tablet Take 2 tablets (200 mg total) by mouth daily. 12/30/19  Yes [name], MD  cetirizine (ZYRTEC) 10 MG tablet Take 10 mg by mouth daily.    [provider]    Past Medical History:  Diagnosis Date  . Depression     No past surgical history on file.  Social History   Tobacco Use  . Smoking status: Never Smoker  . Smokeless tobacco: Never Used   Substance Use Topics  . Alcohol use: No    Family History  Problem Relation Age of Onset  . Diabetes Sister   . Bipolar disorder Brother   . Bipolar disorder Maternal Grandmother   . Diabetes Maternal Grandfather   . Heart disease Paternal Grandmother   . Elevated Lipids Paternal Grandmother   . Lung cancer Paternal Grandfather   . Gestational diabetes Sister     ROS Per hpi  Objective  Vitals as reported by the patient:  GEN: AAOx3, NAD HEENT: [place]/AT, pupils are symmetrical, EOMI, non-icteric sclera Resp: breathing comfortably, speaking in full sentences Skin: no rashes noted, no pallor Psych: good eye contact, normal mood and affect   ASSESSMENT and PLAN  1. Moderate major depression (HCC) Controlled. Continue current regime. Discussed use of good rx during job/insurance transition times  - sertraline (ZOLOFT) 100 MG tablet; Take 2 tablets (200 mg total) by mouth daily. - buPROPion (WELLBUTRIN XL) 150 MG 24 hr tablet; Take 1 tablet (150 mg total) by mouth daily.  FOLLOW-UP: 6 months   The above assessment and management plan was discussed with the patient. The patient verbalized understanding of and has agreed to the management plan. Patient is aware to call the clinic if symptoms persist or worsen. Patient is aware when to return to the clinic for a follow-up visit. Patient educated on when it is appropriate to go to the emergency department.     [name], MD [facility] [address], [place] [address] Ph.  [phone] Fax [phone]

## 2020-05-19 DIAGNOSIS — T148XXA Other injury of unspecified body region, initial encounter: Secondary | ICD-10-CM | POA: Diagnosis not present

## 2020-05-19 DIAGNOSIS — L089 Local infection of the skin and subcutaneous tissue, unspecified: Secondary | ICD-10-CM | POA: Diagnosis not present

## 2020-05-20 DIAGNOSIS — F331 Major depressive disorder, recurrent, moderate: Secondary | ICD-10-CM | POA: Diagnosis not present

## 2020-05-24 ENCOUNTER — Other Ambulatory Visit: Payer: Self-pay

## 2020-05-24 ENCOUNTER — Ambulatory Visit: Payer: BC Managed Care – PPO | Admitting: Medical

## 2020-05-24 ENCOUNTER — Encounter: Payer: Self-pay | Admitting: Medical

## 2020-05-24 VITALS — BP 141/87 | HR 92 | Temp 97.7°F | Resp 16 | Wt 231.6 lb

## 2020-05-24 DIAGNOSIS — M25521 Pain in right elbow: Secondary | ICD-10-CM

## 2020-05-24 DIAGNOSIS — M71121 Other infective bursitis, right elbow: Secondary | ICD-10-CM | POA: Diagnosis not present

## 2020-05-24 NOTE — Progress Notes (Signed)
   Subjective:    Patient ID: Jean [name], female    DOB: 11/08/1980, 39 y.o.   MRN: [id number]  HPI  39 yo female in non acute distress.  Cut on right elbow noticed  Approximately a week before getting treatment. She did  not treatment wound. Urgent Care in [place] last  Wednesday, went  due to pain treated with  Keflex , Toradol , and Rocephin IM. Patient denies   fever or chills. , no nausea or vomitng.   Tetanus is up to date per patient. Patient is right handed.  Blood pressure (!) 141/87, pulse 92, temperature 97.7 F (36.5 C), temperature source Temporal, resp. rate 16, weight 231 lb 9.6 oz (105.1 kg), SpO2 98 %, unknown if currently breastfeeding.   No Known Allergies   Current Outpatient Medications:  .  buPROPion (WELLBUTRIN XL) 150 MG 24 hr tablet, Take 1 tablet (150 mg total) by mouth daily., Disp: 90 tablet, Rfl: 4 .  cephALEXin (KEFLEX) 500 MG capsule, Take 500 mg by mouth 2 (two) times daily., Disp: , Rfl:  .  cetirizine (ZYRTEC) 10 MG tablet, Take 10 mg by mouth daily., Disp: , Rfl:  .  sertraline (ZOLOFT) 100 MG tablet, Take 2 tablets (200 mg total) by mouth daily., Disp: 180 tablet, Rfl: 4   Review of Systems  Constitutional: Negative for chills and fever.  Musculoskeletal: Positive for joint swelling (right elbow).       Objective:   Physical Exam   FROM internal and external rotation, extention, flexion abduction and adduction. 5/5 right handed grip   mild erythema surrounding elbow,  + swelling , + pain  With palpation.   skin completely intact at elbow. Assessment & Plan:  Right elbow,  Infection s/p wound which is now healed. Recommend patient to follow up with [facility] in [place] or [place] for x-ray , possibly  Aspiration and  Antibiotic treatment. OTC Motrin or Tylenol per package directions for pain and swelling.  Map of [employer] given to patient.  She will decide if she goes to [place] or  [place] after she looks at her  calendar.. She verbalizes understanding and has no questions at discharge.   Patient verbalizes understanding and has no questions at discharge.

## 2020-05-31 DIAGNOSIS — F331 Major depressive disorder, recurrent, moderate: Secondary | ICD-10-CM | POA: Diagnosis not present

## 2020-05-31 DIAGNOSIS — M71121 Other infective bursitis, right elbow: Secondary | ICD-10-CM | POA: Diagnosis not present

## 2020-06-21 DIAGNOSIS — F331 Major depressive disorder, recurrent, moderate: Secondary | ICD-10-CM | POA: Diagnosis not present

## 2020-07-07 DIAGNOSIS — F331 Major depressive disorder, recurrent, moderate: Secondary | ICD-10-CM | POA: Diagnosis not present
